# Patient Record
Sex: Male | Born: 1960 | Race: White | Hispanic: No | Smoking: Current some day smoker
Health system: Southern US, Community
[De-identification: ages and names within clinical notes are randomized; demographics above are authoritative.]

## PROBLEM LIST (undated history)

## (undated) DIAGNOSIS — I219 Acute myocardial infarction, unspecified: Secondary | ICD-10-CM

## (undated) DIAGNOSIS — F1910 Other psychoactive substance abuse, uncomplicated: Secondary | ICD-10-CM

## (undated) DIAGNOSIS — F419 Anxiety disorder, unspecified: Secondary | ICD-10-CM

## (undated) DIAGNOSIS — F32A Depression, unspecified: Secondary | ICD-10-CM

## (undated) DIAGNOSIS — I8500 Esophageal varices without bleeding: Secondary | ICD-10-CM

## (undated) DIAGNOSIS — K829 Disease of gallbladder, unspecified: Secondary | ICD-10-CM

## (undated) DIAGNOSIS — I2510 Atherosclerotic heart disease of native coronary artery without angina pectoris: Secondary | ICD-10-CM

## (undated) DIAGNOSIS — E785 Hyperlipidemia, unspecified: Secondary | ICD-10-CM

## (undated) DIAGNOSIS — K7460 Unspecified cirrhosis of liver: Secondary | ICD-10-CM

## (undated) DIAGNOSIS — K219 Gastro-esophageal reflux disease without esophagitis: Secondary | ICD-10-CM

## (undated) HISTORY — DX: Anxiety disorder, unspecified: F41.9

## (undated) HISTORY — DX: Gastro-esophageal reflux disease without esophagitis: K21.9

## (undated) HISTORY — DX: Acute myocardial infarction, unspecified: I21.9

## (undated) HISTORY — PX: KNEE SURGERY: SHX244

## (undated) HISTORY — DX: Depression, unspecified: F32.A

---

## 2000-07-21 ENCOUNTER — Encounter: Payer: Self-pay | Admitting: Emergency Medicine

## 2000-07-21 ENCOUNTER — Emergency Department (HOSPITAL_COMMUNITY): Admission: EM | Admit: 2000-07-21 | Discharge: 2000-07-21 | Payer: Self-pay | Admitting: Emergency Medicine

## 2000-08-12 ENCOUNTER — Encounter: Admission: RE | Admit: 2000-08-12 | Discharge: 2000-09-17 | Payer: Self-pay | Admitting: Occupational Medicine

## 2001-05-05 ENCOUNTER — Ambulatory Visit (HOSPITAL_BASED_OUTPATIENT_CLINIC_OR_DEPARTMENT_OTHER): Admission: RE | Admit: 2001-05-05 | Discharge: 2001-05-06 | Payer: Self-pay | Admitting: Orthopaedic Surgery

## 2002-02-01 ENCOUNTER — Encounter: Admission: RE | Admit: 2002-02-01 | Discharge: 2002-04-27 | Payer: Self-pay | Admitting: Orthopaedic Surgery

## 2002-03-02 ENCOUNTER — Emergency Department (HOSPITAL_COMMUNITY): Admission: EM | Admit: 2002-03-02 | Discharge: 2002-03-03 | Payer: Self-pay | Admitting: *Deleted

## 2007-12-19 ENCOUNTER — Ambulatory Visit: Payer: Self-pay | Admitting: Family Medicine

## 2007-12-19 DIAGNOSIS — K7460 Unspecified cirrhosis of liver: Secondary | ICD-10-CM | POA: Insufficient documentation

## 2007-12-21 ENCOUNTER — Telehealth: Payer: Self-pay | Admitting: Family Medicine

## 2008-12-04 ENCOUNTER — Emergency Department (HOSPITAL_COMMUNITY): Admission: EM | Admit: 2008-12-04 | Discharge: 2008-12-04 | Payer: Self-pay | Admitting: Emergency Medicine

## 2008-12-21 ENCOUNTER — Inpatient Hospital Stay (HOSPITAL_COMMUNITY): Admission: EM | Admit: 2008-12-21 | Discharge: 2008-12-23 | Payer: Self-pay | Admitting: Emergency Medicine

## 2009-02-08 ENCOUNTER — Ambulatory Visit: Payer: Self-pay | Admitting: Cardiovascular Disease

## 2009-02-08 ENCOUNTER — Inpatient Hospital Stay (HOSPITAL_COMMUNITY): Admission: EM | Admit: 2009-02-08 | Discharge: 2009-02-09 | Payer: Self-pay | Admitting: Emergency Medicine

## 2009-11-26 IMAGING — NM NM LIVER FUNCTION STUDY
1 series · 6 of 6 positions shown · non-contrast
Comparison: None

CLINICAL DATA: Cirrhosis, chest pain, question biliary obstruction

NUCLEAR MEDICINE HEPATOBILIARY IMAGING
TECHNIQUE: Sequential images of the abdomen were obtained [DATE] minutes following intravenous administration of
radiopharmaceutical.
Radiopharmaceutical:  4.7  mCi Bc-GGm Choletec

[he hepatobiliary · 3.22mm/px · 6 of 60 frames shown]
[frame 6/60]
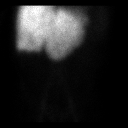
[frame 16/60]
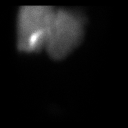
[frame 26/60]
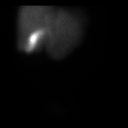
[frame 36/60]
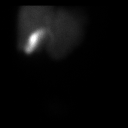
[frame 46/60]
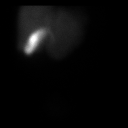
[frame 56/60]
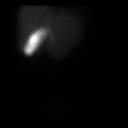

[6 of 6 positions shown; findings below may reference images not displayed]

FINDINGS: Prompt tracer extraction from bloodstream.
Prompt excretion of tracer into biliary tree.
Gallbladder visualized by 13 minutes.
Small bowel visualized at 65 minutes.
Prolonged hepatocellular retention of tracer indicating
hepatocellular dysfunction, compatible with diagnosis of cirrhosis.
A significant degree of tracer retention persists at 85 minutes.
No evidence of CBD or cystic duct obstruction.
IMPRESSION: Patent biliary tree without evidence of CBD or cystic duct
obstruction.
Hepatocellular dysfunction compatible with stated diagnosis of
cirrhosis.

## 2010-02-11 ENCOUNTER — Encounter: Admission: RE | Admit: 2010-02-11 | Discharge: 2010-02-11 | Payer: Self-pay | Admitting: Family Medicine

## 2010-11-09 LAB — COMPREHENSIVE METABOLIC PANEL
AST: 77 U/L — ABNORMAL HIGH (ref 0–37)
Albumin: 2.9 g/dL — ABNORMAL LOW (ref 3.5–5.2)
Alkaline Phosphatase: 948 U/L — ABNORMAL HIGH (ref 39–117)
BUN: 15 mg/dL (ref 6–23)
Chloride: 103 mEq/L (ref 96–112)
Potassium: 3.5 mEq/L (ref 3.5–5.1)
Total Bilirubin: 0.8 mg/dL (ref 0.3–1.2)

## 2010-11-09 LAB — BASIC METABOLIC PANEL
BUN: 11 mg/dL (ref 6–23)
CO2: 24 mEq/L (ref 19–32)
Calcium: 9.6 mg/dL (ref 8.4–10.5)
Chloride: 101 mEq/L (ref 96–112)
Creatinine, Ser: 0.69 mg/dL (ref 0.4–1.5)
Glucose, Bld: 107 mg/dL — ABNORMAL HIGH (ref 70–99)

## 2010-11-09 LAB — POCT CARDIAC MARKERS: Myoglobin, poc: 166 ng/mL (ref 12–200)

## 2010-11-09 LAB — RAPID URINE DRUG SCREEN, HOSP PERFORMED
Amphetamines: NOT DETECTED
Benzodiazepines: NOT DETECTED

## 2010-11-09 LAB — CARDIAC PANEL(CRET KIN+CKTOT+MB+TROPI)
CK, MB: 3.6 ng/mL (ref 0.3–4.0)
Relative Index: INVALID (ref 0.0–2.5)
Troponin I: 0.08 ng/mL — ABNORMAL HIGH (ref 0.00–0.06)
Troponin I: 0.08 ng/mL — ABNORMAL HIGH (ref 0.00–0.06)

## 2010-11-09 LAB — D-DIMER, QUANTITATIVE: D-Dimer, Quant: 0.4 ug/mL-FEU (ref 0.00–0.48)

## 2010-11-09 LAB — CBC
HCT: 32.9 % — ABNORMAL LOW (ref 39.0–52.0)
MCHC: 34.9 g/dL (ref 30.0–36.0)
MCV: 84.5 fL (ref 78.0–100.0)
Platelets: 117 10*3/uL — ABNORMAL LOW (ref 150–400)
Platelets: 118 10*3/uL — ABNORMAL LOW (ref 150–400)
RBC: 3.88 MIL/uL — ABNORMAL LOW (ref 4.22–5.81)
RDW: 14.5 % (ref 11.5–15.5)
WBC: 5.9 10*3/uL (ref 4.0–10.5)

## 2010-11-09 LAB — LIPID PANEL
LDL Cholesterol: 212 mg/dL — ABNORMAL HIGH (ref 0–99)
Triglycerides: 124 mg/dL (ref <150)

## 2010-11-09 LAB — DIFFERENTIAL
Basophils Absolute: 0 10*3/uL (ref 0.0–0.1)
Basophils Relative: 0 % (ref 0–1)
Eosinophils Absolute: 0 10*3/uL (ref 0.0–0.7)
Monocytes Relative: 10 % (ref 3–12)
Neutrophils Relative %: 71 % (ref 43–77)

## 2010-11-09 LAB — POCT I-STAT, CHEM 8
Calcium, Ion: 1.12 mmol/L (ref 1.12–1.32)
Chloride: 104 mEq/L (ref 96–112)
HCT: 36 % — ABNORMAL LOW (ref 39.0–52.0)
Sodium: 138 mEq/L (ref 135–145)
TCO2: 23 mmol/L (ref 0–100)

## 2010-11-09 LAB — APTT: aPTT: 42 seconds — ABNORMAL HIGH (ref 24–37)

## 2010-11-09 LAB — HEMOGLOBIN A1C: Mean Plasma Glucose: 103 mg/dL

## 2010-11-09 LAB — CK TOTAL AND CKMB (NOT AT ARMC): Total CK: 117 U/L (ref 7–232)

## 2010-11-09 LAB — TSH: TSH: 1.066 u[IU]/mL (ref 0.350–4.500)

## 2010-11-11 LAB — CBC
HCT: 32.2 % — ABNORMAL LOW (ref 39.0–52.0)
HCT: 36.7 % — ABNORMAL LOW (ref 39.0–52.0)
HCT: 38.4 % — ABNORMAL LOW (ref 39.0–52.0)
HCT: 39.1 % (ref 39.0–52.0)
HCT: 38.2 % — ABNORMAL LOW (ref 39.0–52.0)
Hemoglobin: 13.3 g/dL (ref 13.0–17.0)
MCHC: 34.5 g/dL (ref 30.0–36.0)
MCHC: 35.1 g/dL (ref 30.0–36.0)
MCV: 83.4 fL (ref 78.0–100.0)
MCV: 84.3 fL (ref 78.0–100.0)
MCV: 84.7 fL (ref 78.0–100.0)
MCV: 84.8 fL (ref 78.0–100.0)
Platelets: 100 10*3/uL — ABNORMAL LOW (ref 150–400)
Platelets: 116 10*3/uL — ABNORMAL LOW (ref 150–400)
Platelets: 96 10*3/uL — ABNORMAL LOW (ref 150–400)
Platelets: 106 10*3/uL — ABNORMAL LOW (ref 150–400)
RBC: 3.82 MIL/uL — ABNORMAL LOW (ref 4.22–5.81)
RBC: 4.59 MIL/uL (ref 4.22–5.81)
RDW: 15 % (ref 11.5–15.5)
RDW: 15.2 % (ref 11.5–15.5)
RDW: 15.6 % — ABNORMAL HIGH (ref 11.5–15.5)
WBC: 4.2 10*3/uL (ref 4.0–10.5)
WBC: 5.4 10*3/uL (ref 4.0–10.5)
WBC: 6.8 10*3/uL (ref 4.0–10.5)

## 2010-11-11 LAB — PHOSPHORUS: Phosphorus: 4.3 mg/dL (ref 2.3–4.6)

## 2010-11-11 LAB — POCT I-STAT, CHEM 8
BUN: 18 mg/dL (ref 6–23)
Creatinine, Ser: 1.1 mg/dL (ref 0.4–1.5)
Hemoglobin: 14.3 g/dL (ref 13.0–17.0)
Potassium: 3.7 mEq/L (ref 3.5–5.1)
Sodium: 138 mEq/L (ref 135–145)

## 2010-11-11 LAB — CARDIAC PANEL(CRET KIN+CKTOT+MB+TROPI)
CK, MB: 2.6 ng/mL (ref 0.3–4.0)
Relative Index: 4.2 — ABNORMAL HIGH (ref 0.0–2.5)
Total CK: 100 U/L (ref 7–232)
Total CK: 64 U/L (ref 7–232)
Troponin I: 0.14 ng/mL — ABNORMAL HIGH (ref 0.00–0.06)

## 2010-11-11 LAB — COMPREHENSIVE METABOLIC PANEL
AST: 103 U/L — ABNORMAL HIGH (ref 0–37)
AST: 82 U/L — ABNORMAL HIGH (ref 0–37)
AST: 79 U/L — ABNORMAL HIGH (ref 0–37)
Albumin: 3.7 g/dL (ref 3.5–5.2)
Albumin: 3.6 g/dL (ref 3.5–5.2)
Alkaline Phosphatase: 850 U/L — ABNORMAL HIGH (ref 39–117)
BUN: 15 mg/dL (ref 6–23)
CO2: 26 mEq/L (ref 19–32)
Calcium: 8.9 mg/dL (ref 8.4–10.5)
Calcium: 9.6 mg/dL (ref 8.4–10.5)
Chloride: 100 mEq/L (ref 96–112)
Chloride: 108 mEq/L (ref 96–112)
Chloride: 107 mEq/L (ref 96–112)
Creatinine, Ser: 0.99 mg/dL (ref 0.4–1.5)
Creatinine, Ser: 1 mg/dL (ref 0.4–1.5)
GFR calc Af Amer: >60 mL/min (ref >60)
GFR calc Af Amer: >60 mL/min (ref >60)
GFR calc non Af Amer: >60 mL/min (ref >60)
Glucose, Bld: 141 mg/dL — ABNORMAL HIGH (ref 70–99)
Potassium: 3.6 mEq/L (ref 3.5–5.1)
Total Bilirubin: 0.8 mg/dL (ref 0.3–1.2)
Total Bilirubin: 1.5 mg/dL — ABNORMAL HIGH (ref 0.3–1.2)
Total Bilirubin: 2.2 mg/dL — ABNORMAL HIGH (ref 0.3–1.2)
Total Protein: 7.2 g/dL (ref 6.0–8.3)

## 2010-11-11 LAB — D-DIMER, QUANTITATIVE: D-Dimer, Quant: 0.26 ug/mL-FEU (ref 0.00–0.48)

## 2010-11-11 LAB — PROTIME-INR
INR: 1 (ref 0.00–1.49)
Prothrombin Time: 12.8 seconds (ref 11.6–15.2)

## 2010-11-11 LAB — LIPID PANEL
HDL: 128 mg/dL (ref >39)
Triglycerides: 61 mg/dL (ref <150)
VLDL: 12 mg/dL (ref 0–40)

## 2010-11-11 LAB — T4, FREE: Free T4: 0.96 ng/dL (ref 0.80–1.80)

## 2010-11-11 LAB — GAMMA GT: GGT: 1274 U/L — ABNORMAL HIGH (ref 7–51)

## 2010-11-11 LAB — RAPID URINE DRUG SCREEN, HOSP PERFORMED
Barbiturates: NOT DETECTED
Cocaine: POSITIVE — AB
Opiates: POSITIVE — AB
Opiates: NOT DETECTED
Tetrahydrocannabinol: POSITIVE — AB
Tetrahydrocannabinol: POSITIVE — AB

## 2010-11-11 LAB — GLUCOSE, CAPILLARY
Glucose-Capillary: 105 mg/dL — ABNORMAL HIGH (ref 70–99)
Glucose-Capillary: 126 mg/dL — ABNORMAL HIGH (ref 70–99)
Glucose-Capillary: 170 mg/dL — ABNORMAL HIGH (ref 70–99)
Glucose-Capillary: 227 mg/dL — ABNORMAL HIGH (ref 70–99)

## 2010-11-11 LAB — DIFFERENTIAL
Basophils Absolute: 0.1 10*3/uL (ref 0.0–0.1)
Basophils Absolute: 0.1 10*3/uL (ref 0.0–0.1)
Eosinophils Absolute: 0.1 10*3/uL (ref 0.0–0.7)
Eosinophils Relative: 2 % (ref 0–5)
Lymphocytes Relative: 17 % (ref 12–46)
Lymphs Abs: 1.3 10*3/uL (ref 0.7–4.0)
Monocytes Absolute: 0.7 10*3/uL (ref 0.1–1.0)
Neutrophils Relative %: 72 % (ref 43–77)

## 2010-11-11 LAB — TSH: TSH: 2.057 u[IU]/mL (ref 0.350–4.500)

## 2010-11-11 LAB — BASIC METABOLIC PANEL
BUN: 15 mg/dL (ref 6–23)
GFR calc Af Amer: >60 mL/min (ref >60)
GFR calc non Af Amer: >60 mL/min (ref >60)
Potassium: 3.8 mEq/L (ref 3.5–5.1)

## 2010-11-11 LAB — POCT CARDIAC MARKERS
CKMB, poc: 5.6 ng/mL (ref 1.0–8.0)
Myoglobin, poc: 141 ng/mL (ref 12–200)
Troponin i, poc: <0.05 ng/mL (ref 0.00–0.09)

## 2010-11-11 LAB — CK TOTAL AND CKMB (NOT AT ARMC)
CK, MB: 6.2 ng/mL — ABNORMAL HIGH (ref 0.3–4.0)
Relative Index: 3.8 — ABNORMAL HIGH (ref 0.0–2.5)

## 2010-11-11 LAB — APTT: aPTT: 33 seconds (ref 24–37)

## 2010-11-11 LAB — HEMOGLOBIN A1C: Mean Plasma Glucose: 100 mg/dL

## 2010-11-11 LAB — MAGNESIUM: Magnesium: 2.4 mg/dL (ref 1.5–2.5)

## 2010-11-11 LAB — HEPARIN LEVEL (UNFRACTIONATED): Heparin Unfractionated: 0.2 IU/mL — ABNORMAL LOW (ref 0.30–0.70)

## 2010-12-16 NOTE — H&P (Signed)
NAME:  [name] Ford, [name] Ford NO.:  [id number]   MEDICAL RECORD NO.:  [id number]          PATIENT TYPE:  EMS   LOCATION:  MAJO                         FACILITY:  [facility]   PHYSICIAN:  Manus Gunning, MD      DATE OF BIRTH:  September 11, 1960   DATE OF ADMISSION:  12/21/2008  DATE OF DISCHARGE:                              HISTORY & PHYSICAL   CHIEF COMPLAINT:  Chest pain.   HISTORY OF PRESENT ILLNESS:  [name] Ford is a pleasant 49 year-old  Caucasian male who presented with chief complaint with substernal chest  pain describes as sharp in nature, 7 out of 10, worse with exertion,  improved with rest.  No overt precipitating factor.  He said it occurred  post smoking crack cocaine and tetrahydrocannabinol.  He denies any  radiation. He claims that the pain is sporadic in nature, associated  with occasional diaphoresis.  He denies nausea or vomiting.  The patient  was recently admitted to [facility] in May 2010  at which time a workup demonstrated an ejection fraction of 55% with  normal wall motion.  Cardiac catheterization demonstrated the patient to  have a 50% RCA lesion as well as 50% ramus lesion.  At this time the  patient denies headache, syncope, presyncope.  No tinnitus, no blurring  of vision, no odynophagia, no dysphagia, no palpitations, no PND, no  orthopnea.  No neck fullness. No fevers.  No loss of consciousness.  No  seizures.  No abdominal pain.  No nausea or vomiting.  No diarrhea,  constipation, dysuria, polyuria, hematuria, melenic stool or bright red  blood per rectum.   PAST MEDICAL/ PAST SURGICAL HISTORY:  1. Dyslipidemia.  2. Cirrhosis.  3. Diabetes mellitus type 2.   SOCIAL HISTORY:  Positive for tobacco.  He has a history of 30 pack  years.  Denies alcohol.  Positive for illicits - tetrahydrocannabinol  and cocaine.   FAMILY HISTORY:  Mother has no medical problems.  Father had history of  hypertension and died secondary to  CVA.   ALLERGIES:  No known drug allergies.   HOME MEDICATIONS:  Apparently the only medication knows is metformin 850  mg p.o. daily.   REVIEW OF SYSTEMS:  A 14 point review of systems was performed.  Pertinent positives and negatives as described above.   PHYSICAL EXAMINATION:  VITAL SIGNS ARE TIME OF PRESENTATION:  Temperature 97.2, heart rate 71, respiratory rate 18, blood pressure  115/60, O2 saturation 98% on room air.  GENERAL:  Well developed, well nourished Caucasian male sitting up in  bed comfortably in no acute distress.  HEENT:  Normocephalic, atraumatic.  Moist oral mucosa.  No thrush,  erythema or postnasal drip.  Eyes - anicteric.  Extraocular muscles  intact..  Pupils equal, round, reactive to light and accommodation.  CARDIOVASCULAR:  S1 and S2 normal.  Regular rate and rhythm.  No  murmurs, rubs or gallops.  RESPIRATORY:  Air entry is bilaterally equal.  No rales, rhonchi or  wheezing appreciated.  ABDOMEN:  Soft, nontender, nondistended.  Positive bowel sounds.  No  organomegaly.  EXTREMITIES:  No clubbing, cyanosis or edema.  Positive bilateral  dorsalis pedis pulses.  CNS:  Alert and oriented x 3. Cranial nerves II through XII grossly  intact.  Power, sensation, reflexes bilaterally symmetrical.  SKIN:  No breakdown, swelling, ulcerations or masses.  HEME/ONC:  No palpable lymphadenopathy, ecchymosis, bruising or  petechiae.   LABORATORY DATA:  Urine drug screen positive for cocaine, opiates and  tetrahydrocannabinol.   Troponin I less than 0.05.  CK MB 5.6, myoglobin 141.   Sodium 138, potassium 3.7, chloride 106, BUN 18, creatinine  1.1.   White blood cell count 20,300, hemoglobin 13.4, hematocrit 39.1,  platelets 116, polymorphs 72.   ASSESSMENT AND PLAN:  1. Chest pain.  This is most likely secondary to cardiovascular      stress inflicted by the use of cocaine.  I have discussed this with      the patient.  I have also explained to him he is at  risk for      myocardial infarction  and I feel he would be best be served by an      observation admission for serial cardiac enzymes for additional      hours.  If negative, the patient may be discharged home the      following morning.  I do not feel that 2D echocardiogram is      warranted at this time.  He will be started on aspirin 325 mg p.o.      daily as well as Zocor 40 mg p.o. q.h.s..  At this time we will      hold his metformin in case he does rule in.  Cardiac      catheterization may be required.  We will cover him with sliding      scale insulin protocol on a sensitive scale, start normal saline at      125 cc/hour.  I have also reviewed his chest x-ray personally; it      demonstrates no active cardiopulmonary disease.  I reviewed his EKG      and it demonstrates normal sinus rhythm with nonspecific ST-T wave      changes.  2. Tobacco/ cocaine/ tetrahydrocannabinol use.  I have counseled the      patient and explained to him the risks to his body and his health      with continued use of these drugs. He expressed understanding and      claims he will try to get into a rehabilitation program.      Manus Gunning, MD  Electronically Signed     SP/MEDQ  D:  12/21/2008  T:  12/21/2008  Job:  [id number]

## 2010-12-16 NOTE — H&P (Signed)
NAMESAMIR, ISHAQ NO.:  [id number]   MEDICAL RECORD NO.:  [id number]          PATIENT TYPE:  INP   LOCATION:  2923                         FACILITY:  [facility]   PHYSICIAN:  Noralyn Pick. Eden Emms, MD, FACCDATE OF BIRTH:  [birth year]   DATE OF ADMISSION:  02/08/2009  DATE OF DISCHARGE:                              HISTORY & PHYSICAL   Of note, Mr. Limberg's repeat blood work here in the ED has show elevated  troponin of 0.10.  He does have mild chest discomfort.  I discussed it  with Dr. Antoine Poche as well as with the patient, and we will plan to admit  and further evaluate for non-ST-elevation MI.  We will avoid beta-  blocker therapy in the setting of recent cocaine usage.  We will add  aspirin, heparin, statin, and nitrate.  The patient was initially  agreeable, but does say he may need to leave.  I advised that if he  left, he would be leaving against medical advice.      Nicolasa Ducking, ANP      Noralyn Pick. Eden Emms, MD, [facility]  Electronically Signed    CB/MEDQ  D:  02/08/2009  T:  02/09/2009  Job:  [id number]

## 2010-12-16 NOTE — Discharge Summary (Signed)
[name], [name]                ACCOUNT NO.:  [id number]   MEDICAL RECORD NO.:  [id number]          PATIENT TYPE:  OBV   LOCATION:  3730                         FACILITY:  [facility]   PHYSICIAN:  Charlestine Massed, MDDATE OF BIRTH:  05/03/1961   DATE OF ADMISSION:  12/21/2008  DATE OF DISCHARGE:  12/23/2008                               DISCHARGE SUMMARY   PRIMARY CARE PHYSICIAN:  Unassigned.   DISPOSITION:  The patient left the hospital without informing the nurse  and without signing an against medical advice form, on his own will.   DISCHARGE DIAGNOSES:  1. Active cocaine use and cocaine abuse.  2. Chest pain occurring at the time of active cocaine use.  3. Continuous tobacco, marijuana, and cocaine abuse.  4. Recent cardiac catheterization at [facility] showing 50%      RCA lesion and 50% ramus lesion, and no significant obstruction      otherwise.  5. Questionable diagnosis of diabetes mellitus but currently      hemoglobin A1c is 5.1.  6. Chronic gallbladder disease, possibly secondary to prior      cholecystitis, and exact etiology and pathology not known.   MEDICATIONS AT DISCHARGE:  1. Aspirin 325 mg daily.  2. NovoLog coverage on sensitive scale.  3. Zocor 40 mg p.o. daily.  4. IV fluids.   HOSPITAL COURSE BY PROBLEM:  1. Chest pain.  the patient came to the emergency room complaining of      chest pain.  This happened when he was actively snorting      cocaine/crack.  The patient has been using crack for quite some      time.  The chest pain was sharp in nature.  He was admitted for      evaluation of chest pain.  The EKG did not show any significant      changes.  There was slight elevation in troponin, which went up to      0.10 with the patient asymptomatic soon after he came to the      emergency room.  The patient was observed in view of that, and      decided the troponin was attributed to minimal cardiac injury that      happened with acute  cocaine use.  He had a recent CABG, which      showed a 50% RCA lesion and a ramus lesion of the RCA which was      done recently, and so no further evaluation currently needed at      this time.  The patient was being observed in the hospital.  2. Abdominal pain, questionable.  The patient complained of vague pain      in the abdomen.  The patient was evaluated and there was no      palpable guarding or organomegaly in the right upper quadrant.      Based on sonogram done yesterday, which revealed distended      gallbladder with pericholecystic fluid, and the common bile duct      was 6.4 mm.  The pancreatic duct was  also slightly prominent at 2      mm and the small nonspecific node was seen in the porta hepatis at      1.6 cm.  The spleen was also mildly enlarged, but upper limits of      normal at 13 cm total length.  The patient was advised to stay in      the hospital and he was planned for a HIDA scan today to rule out      gallbladder obstruction.  Sonogram did not reveal any stones.  The      patient was being planned to get the HIDA scan now.  Before that,      he walked out of the hospital by himself.  He was alert and awake,      and he was very well capable of making his own decisions at that      time.  3. Chronic cocaine and tobacco use, and marijuana use.  The patient      was educated very well during his stay about his issues.  He did      not have insurance to take care of most of these things, so social      worker consult was also called, and she spoke to him and she said      she will advise the financial advisor to come and speak to him.      The patient left the hospital before further steps could be done.   DISPOSITION:  The patient walked out of the hospital and he was very  well alert and awake, and capable of making his own decisions shortly  before he left.  A total of 30 minutes spent on this discussion with the  nurse and evaluating the incident.       Charlestine Massed, MD  Electronically Signed     UT/MEDQ  D:  12/23/2008  T:  12/23/2008  Job:  [id number]

## 2010-12-16 NOTE — Consult Note (Signed)
[name], [name] NO.:  [id number]   MEDICAL RECORD NO.:  [id number]          PATIENT TYPE:  INP   LOCATION:  2923                         FACILITY:  [facility]   PHYSICIAN:  [name] Pick. Eden Emms, MD, FACCDATE OF BIRTH:  31-Jan-1961   DATE OF CONSULTATION:  02/08/2009  DATE OF DISCHARGE:                                 CONSULTATION   PATIENT PROFILE:  A 50 year old Caucasian male with history of crack  cocaine use as well as chest pain who presents with recurrent chest  pain.   PROBLEMS:  1. Chest pain.      a.     Multiple admissions in the setting of crack cocaine use.      b.     Status post catheterization in [place] earlier this year,       70-50% lesion in the right coronary artery and ramus, otherwise,       nonobstructive disease, this is a previous report.      c.     Status post recent admission in May where the patient left       against medical advice.  2. Polysubstance abuse including marijuana, tobacco, and cocaine.  3. Hyperlipidemia.  4. Cirrhosis.  5. Questionable diabetes mellitus with hemoglobin A1c of 5.1 in May.  6. Chronic gallbladder disease.   HISTORY OF PRESENT ILLNESS:  A 50 year old Caucasian male with a history  of chest pain always in the setting of smoking tobacco and cocaine.  He  apparently had a cath in November 09, 2008, in [place] that showed  nonobstructive disease.  He was admitted in May 2010 to [facility] by  the Medicine Service with complaints of chest pain and positive crack  usage.  He subsequently ruled out and left AMA.  He has had no chest  pain since May 2010.  He says he only smokes crack about once a month  but was smoking all last night, all morning, and all afternoon.  About  1:30 p.m., he developed 10/10 substernal chest pressure, shortness of  breath, and presented to the [facility] ED.  He was treated with heparin  and nitroglycerin with reduction of discomfort to 2/10.  He says he is  feeling much better and  wants to go home.   ALLERGIES:  No known drug allergies.   HOME MEDICATIONS:  1. Metformin 850 mg daily.  2. Pravastatin 10 mg daily.   FAMILY HISTORY:  Mother is alive and well at 45.  Father died of a  stroke with history of hypertension in the 47.   SOCIAL HISTORY:  He lives in [place] with his son.  He delivers  pizza.  He has a 30 plus-pack-year history of tobacco abuse but  currently smoking 1 pack a day.  Denies alcohol use.  He smokes crack  cocaine monthly.  He smokes marijuana a few times a week.   REVIEW OF SYSTEMS:  Positive for chest pain and dyspnea; otherwise, all  systems reviewed and negative.  He is a full code.   PHYSICAL EXAMINATION:  VITAL SIGNS:  Temperature  97.9, heart rate 72,  respirations 18, blood pressure 114/65, pulse ox 90% on room air.  GENERAL:  Pleasant white male, in no acute distress, awake, alert and  oriented x3, normal affect.  HEENT:  Normal.  NEURO:  Grossly intact, nonfocal.  SKIN:  Without lesions or masses.  MUSCULOSKELETAL:  Grossly normal without deformity or effusion.  NECK:  Supple without bruits or JVD.  LUNGS:  Respirations regular and unlabored.  Clear to auscultation.  CARDIAC:  Regular, S1 and S2.  No S3, S4, murmurs.  ABDOMEN:  Round, soft, nontender, nondistended.  Bowel sounds present  x4.  EXTREMITIES:  Warm, dry, and pink.  No clubbing, cyanosis, or edema.  Dorsalis pedis, posterior tibial pulses 2+ and equal bilaterally.   Chest x-ray shows no acute cardiopulmonary abnormality.  EKG shows sinus  rhythm, at rate of 79, T-wave inversion, aVL.   Hemoglobin 12.0, hematocrit 34.4, WBCs 6.5, platelets 117.  Sodium 136,  potassium 3.6, chloride 101, CO2 of 24, BUN 11, creatinine 0.69, glucose  107.  D-dimer 0.40, CK-MB 5.5.  Troponin I less than 0.5.   ASSESSMENT AND PLAN:  1. Chest pain.  This is in the setting of crack cocaine usage.  First      set of enzymes are negative.  ECG shows no significant changes.   We      will repeat one set of enzymes and if negative, okay to discharge      from cardiac standpoint.  2. Polysubstance abuse.  Cessation advised in all fronts.  The patient      is not motivated to quit.      [name] Ford, ANP      [name] Pick. Eden Emms, MD, [facility]  Electronically Signed    CB/MEDQ  D:  02/08/2009  T:  02/09/2009  Job:  [id number]

## 2010-12-19 NOTE — Discharge Summary (Signed)
[name], Ford                ACCOUNT NO.:  [id number]   MEDICAL RECORD NO.:  [id number]          PATIENT TYPE:  INP   LOCATION:  3707                         FACILITY:  [facility]   PHYSICIAN:  Noralyn Pick. Eden Emms, MD, FACCDATE OF BIRTH:  1961/06/23   DATE OF ADMISSION:  02/08/2009  DATE OF DISCHARGE:  02/09/2009                               DISCHARGE SUMMARY   PRIMARY CARDIOLOGIST:  Theron Arista C. Eden Emms, MD, [facility]   DISCHARGE DIAGNOSIS:  Non-ST-elevation myocardial infarction.   SECONDARY DIAGNOSES:  1. Polysubstance abuse including marijuana, tobacco, and cocaine, all      ongoing.  2. Hyperlipidemia.  3. Cirrhosis.  4. Chronic gallbladder disease.  5. Noncompliance.   ALLERGIES:  No known drug allergies.   PROCEDURES:  None.   HISTORY OF PRESENT ILLNESS:  A 50 year old Caucasian male with prior  history of chest pain in the setting of cocaine abuse.  He presented to  [facility] on February 08, 2009, after having used marijuana and  cocaine throughout the prior evening and early morning hours of the February 08, 2009.  In the ED, he is known to have elevated troponin at 0.10 and  decision was made to admit him for further evaluation.  His ECG showed  no acute changes.   HOSPITAL COURSE:  Followup cardiac enzymes showed troponin of 0.08 x2  with normal CKs and MBs.  Arrangements were made for 2D echocardiogram  as well as cardiac catheterization; however, the patient left AMA on  February 09, 2009.  Labs at the time of leaving, hemoglobin 11.3, hematocrit  32.9, WBC 5.9, platelets 118.  Sodium 139, potassium 3.5, chloride 103,  CO2 of 28, BUN 15, creatinine 0.74, glucose 156.  Total bilirubin 0.8,  alkaline phosphatase 948, AST 77, ALT 88, total protein 6.5, albumin  2.9, calcium 9.0.  Hemoglobin A1c 5.2.  CK less than  7, MB 2.3, troponin I 0.08.  Total cholesterol is 326, triglycerides  124, HDL 89, LDL 212.  TSH 1.066.  Urine drug screen was positive for  cocaine and THC.   DISPOSITION:  The patient left the hospital against medical advice.      Nicolasa Ducking, ANP      Noralyn Pick. Eden Emms, MD, [facility]  Electronically Signed    CB/MEDQ  D:  03/13/2009  T:  03/14/2009  Job:  [id number]

## 2010-12-19 NOTE — Op Note (Signed)
[place]  Patient:    [name] Ford, [name] Ford Visit Number: [id number] MRN: [id number]          Service Type: DSU Location: [facility] Attending Physician:  Randolm Idol Dictated by:   Claude Manges. Cleophas Dunker, M.D. Proc. Date: 05/05/01 Admit Date:  05/05/2001                             Operative Report  PREOPERATIVE DIAGNOSIS: 1. Tear of anterior cruciate ligament, left knee. 2. Tear of medial meniscus. 3. Tear of lateral meniscus.  POSTOPERATIVE DIAGNOSIS: 1. Tear of anterior cruciate ligament, left knee. 2. Tear of medial meniscus. 3. Tear of lateral meniscus.  OPERATION PERFORMED: 1. Allograft anterior cruciate ligament reconstruction, left knee. 2. Partial medial meniscectomy. 3. Partial lateral meniscectomy.  SURGEON:  Claude Manges. Cleophas Dunker, M.D.  ASSISTANT:  Arnoldo Morale, P.A.  ANESTHESIA:  General orotracheal.  COMPLICATIONS:  None.  INDICATIONS FOR PROCEDURE:  The patient is a 50 year old white male sustained an on-the-job injury to his left knee in December 2001.  He continues to complain of pain, instability and recurrent effusion.  His MRI reveals a complete tear of the anterior cruciate ligament with tears of both the medial and lateral menisci.  He is now to have arthroscopic evaluation.  Probable debridement of medial and lateral menisci and allograft ACL reconstruction.  DESCRIPTION OF PROCEDURE:  With the patient comfortable on the operating table and under general orotracheal anesthesia, the left lower extremity was placed in a thigh tourniquet and thigh holder.  The leg was then prepped with DuraPrep from the thigh holder to the ankle.  Sterile draping was performed.  Diagnostic arthroscopy was performed using medial and lateral parapatellar tendon stab wound.  Irrigation was performed continuously with a cannula in the superior pouch medially.  There was no effusion.  Diagnostic arthroscopy revealed no evidence of loose  material in the superior pouch.  There was no chondromalacia patella.  There was a complete tear of the anterior cruciate ligament.  There were very few fibers remaining.  The posterior cruciate ligament remained intact.  In the medial compartment there was an obvious undersurface tear of the medial meniscus for a little over a centimeter.  It was through-and-through and I could not sublux the fragment anteriorly.  A partial medial meniscectomy was performed over the torn portion.  The rim was tapered anteriorly and posteriorly and then finally shaved with the intra-articular shaver.  There was a move extensive tear of the lateral meniscus.  There was large subluxing fragment attached to the very posterior horn.  There was flipping in the posterior space behind the meniscus and then anteriorly.  I amputated its base and then removed it in one large piece.  There was little remaining meniscus in the rim which was shaved with the intra-articular shaver.  There was further tearing more anteriorly on the meniscus and this was debrided with the basket forceps and then the shaver was introduced.  I had a very stable remnant.  We proceeded with an ACL allograft reconstruction.  A notchplasty was performed using the intra-articular shaver and the hooded bur.  I thought he had a very nice decompression.  There was no injury to the PCL.  The Arthrex guide system was utilized.  Our graft was 100 mm in length with bone plugs 10 mm in diameter and 25 mm in length.  A separate stab wound was made longitudinally medial to the  tibial tubercle and patellar tendon approximately 1 inch to 1-1/2 inch in length.  It was carried down to the subcutaneous tissues and then a subperiosteal dissection was performed.  The Arthrex tibial guide was inserted.  The guide pin was then placed into the joint.  It was a little bit medial in position, so we used a second guide to place the pin through the center of the ACL  stump about 5 to 7 mm anterior to the PCL.  It was directed towards the 1 oclock position in our notch plasty.  I felt this was in excellent position.  It was checked in both medial and lateral portals with the arthroscope.  A reamer was then inserted.  I cleaned the intra-articular hole with a shaver and the pituitary rongeur.  The femoral guide was then inserted and the Beeth needle was then inserted externalized along the anterolateral thigh.  The 10 mm reamer was inserted.  A starter hole was made.  We checked to be sure we had bone circumferentially and then we proceeded to ream approximately 27 to 28 mm.  We checked the hole and I had good bone throughout circumferentially.  The allograft was then inserted.  The suture was placed through the eye of the Beeth needle distal to the tibial hole and then the strings were advanced through the joint and out through the femoral hole.  The graft was then advanced without difficulty through the tibia intra-articular portion into the femoral hole guiding it with a probe and the arthroscope.  I checked to be sure that I had no impingement through a full range of motion and when I would tighten the strings, I had excellent stability with little if any drawer sign. We had approximately 5 mm or more of a drawer sign preoperatively.  A small stab wound was then made through the patellar tendon.  A 7 x 25 sheathed metallic interference screw was then introduced without difficulty over the guide pin placed anteriorly.  The screw was inserted with tension on the graft.  The graft remained intact without injury.  The guide pin was removed.  Joint was inspected.  There was no evidence of any loose material. A second guide pin was then placed over the distal graft into the tibial hole and a 7 x 20 mm screw inserted.  Excellent stability of the graft.  I checked the intra-articular portion and through a full range of motion it was perfectly stable.   There was no impingement and I had a negative anterior drawer sign.  There was full extension.   The joint was irrigated.  It was cleared of any loose material.  The strings were removed from the graft.  The incision distally was closed with several layers with 2-0 Vicryl and then skin clips.  The patellar tendon incision was then closed with clips.  0.25% Marcaine without epinephrine was injected into the wound edges.  Sterile bulky dressing was applied followed by an Ace bandage.  The tourniquet was deflated at an hour and 20 minutes with excellent capillary refill to the toes.  The patient tolerated the procedure well without complications.  PLAN:  [facility]  Percocet for pain.  Office one week. Dictated by: Claude Manges. Cleophas Dunker, M.D. Attending Physician:  Randolm Idol DD:  05/05/01 TD:  05/06/01 Job: [id number]

## 2011-08-21 ENCOUNTER — Other Ambulatory Visit: Payer: Self-pay

## 2011-09-01 ENCOUNTER — Encounter (HOSPITAL_COMMUNITY): Payer: Self-pay | Admitting: *Deleted

## 2011-09-01 ENCOUNTER — Observation Stay (HOSPITAL_COMMUNITY)
Admission: EM | Admit: 2011-09-01 | Discharge: 2011-09-03 | Discharge status: Against Medical Advice | Payer: 59 | Attending: Internal Medicine | Admitting: Internal Medicine

## 2011-09-01 DIAGNOSIS — I10 Essential (primary) hypertension: Secondary | ICD-10-CM | POA: Insufficient documentation

## 2011-09-01 DIAGNOSIS — I2510 Atherosclerotic heart disease of native coronary artery without angina pectoris: Secondary | ICD-10-CM | POA: Insufficient documentation

## 2011-09-01 DIAGNOSIS — E119 Type 2 diabetes mellitus without complications: Secondary | ICD-10-CM | POA: Insufficient documentation

## 2011-09-01 DIAGNOSIS — F17200 Nicotine dependence, unspecified, uncomplicated: Secondary | ICD-10-CM | POA: Insufficient documentation

## 2011-09-01 DIAGNOSIS — D696 Thrombocytopenia, unspecified: Secondary | ICD-10-CM | POA: Diagnosis present

## 2011-09-01 DIAGNOSIS — Z720 Tobacco use: Secondary | ICD-10-CM | POA: Diagnosis present

## 2011-09-01 DIAGNOSIS — R079 Chest pain, unspecified: Principal | ICD-10-CM | POA: Diagnosis present

## 2011-09-01 DIAGNOSIS — K7460 Unspecified cirrhosis of liver: Secondary | ICD-10-CM | POA: Insufficient documentation

## 2011-09-01 DIAGNOSIS — F1910 Other psychoactive substance abuse, uncomplicated: Secondary | ICD-10-CM | POA: Diagnosis present

## 2011-09-01 DIAGNOSIS — I252 Old myocardial infarction: Secondary | ICD-10-CM | POA: Insufficient documentation

## 2011-09-01 DIAGNOSIS — E785 Hyperlipidemia, unspecified: Secondary | ICD-10-CM | POA: Diagnosis present

## 2011-09-01 HISTORY — DX: Unspecified cirrhosis of liver: K74.60

## 2011-09-01 HISTORY — DX: Hyperlipidemia, unspecified: E78.5

## 2011-09-01 HISTORY — DX: Atherosclerotic heart disease of native coronary artery without angina pectoris: I25.10

## 2011-09-01 HISTORY — DX: Other psychoactive substance abuse, uncomplicated: F19.10

## 2011-09-01 HISTORY — DX: Disease of gallbladder, unspecified: K82.9

## 2011-09-01 LAB — GLUCOSE, CAPILLARY: Glucose-Capillary: 244 mg/dL — ABNORMAL HIGH (ref 70–99)

## 2011-09-01 LAB — COMPREHENSIVE METABOLIC PANEL
Alkaline Phosphatase: 919 U/L — ABNORMAL HIGH (ref 39–117)
BUN: 12 mg/dL (ref 6–23)
GFR calc Af Amer: >90 mL/min (ref >90)
Glucose, Bld: 165 mg/dL — ABNORMAL HIGH (ref 70–99)
Potassium: 3.6 mEq/L (ref 3.5–5.1)
Total Protein: 7.6 g/dL (ref 6.0–8.3)

## 2011-09-01 LAB — DIFFERENTIAL
Basophils Absolute: 0 10*3/uL (ref 0.0–0.1)
Eosinophils Absolute: 0.1 10*3/uL (ref 0.0–0.7)
Eosinophils Relative: 1 % (ref 0–5)
Eosinophils Relative: 2 % (ref 0–5)
Lymphocytes Relative: 21 % (ref 12–46)
Lymphs Abs: 1.6 10*3/uL (ref 0.7–4.0)
Monocytes Absolute: 0.5 10*3/uL (ref 0.1–1.0)
Monocytes Relative: 9 % (ref 3–12)
Neutro Abs: 4.2 10*3/uL (ref 1.7–7.7)

## 2011-09-01 LAB — CBC
HCT: 41.6 % (ref 39.0–52.0)
Hemoglobin: 13 g/dL (ref 13.0–17.0)
MCH: 27.5 pg (ref 26.0–34.0)
MCV: 80.3 fL (ref 78.0–100.0)
MCV: 81.9 fL (ref 78.0–100.0)
Platelets: 105 10*3/uL — ABNORMAL LOW (ref 150–400)
RDW: 15 % (ref 11.5–15.5)
WBC: 6.1 10*3/uL (ref 4.0–10.5)

## 2011-09-01 LAB — CARDIAC PANEL(CRET KIN+CKTOT+MB+TROPI)
CK, MB: 4.7 ng/mL — ABNORMAL HIGH (ref 0.3–4.0)
Relative Index: 2.7 — ABNORMAL HIGH (ref 0.0–2.5)
Troponin I: <0.3 ng/mL (ref <0.30)

## 2011-09-01 LAB — CK TOTAL AND CKMB (NOT AT ARMC): Total CK: 254 U/L — ABNORMAL HIGH (ref 7–232)

## 2011-09-01 LAB — CREATININE, SERUM: GFR calc Af Amer: >90 mL/min (ref >90)

## 2011-09-01 LAB — MAGNESIUM: Magnesium: 2.1 mg/dL (ref 1.5–2.5)

## 2011-09-01 LAB — TROPONIN I: Troponin I: <0.3 ng/mL (ref <0.30)

## 2011-09-01 MED ORDER — ENOXAPARIN SODIUM 40 MG/0.4ML ~~LOC~~ SOLN
40.0000 mg | SUBCUTANEOUS | Status: DC
  Administered 2011-09-01 – 2011-09-02 (×2): 40 mg via SUBCUTANEOUS
  Filled 2011-09-01 (×3): qty 0.4

## 2011-09-01 MED ORDER — ASPIRIN 325 MG PO TABS
325.0000 mg | ORAL_TABLET | Freq: Every day | ORAL | Status: DC
  Administered 2011-09-01 – 2011-09-03 (×3): 325 mg via ORAL
  Filled 2011-09-01 (×3): qty 1

## 2011-09-01 MED ORDER — MORPHINE SULFATE 2 MG/ML IJ SOLN
INTRAMUSCULAR | Status: AC
  Filled 2011-09-01: qty 1

## 2011-09-01 MED ORDER — METFORMIN HCL 500 MG PO TABS
1000.0000 mg | ORAL_TABLET | Freq: Two times a day (BID) | ORAL | Status: DC
  Filled 2011-09-01: qty 2

## 2011-09-01 MED ORDER — NITROGLYCERIN 0.4 MG SL SUBL: 0.4000 mg | SUBLINGUAL_TABLET | SUBLINGUAL | Status: DC | PRN

## 2011-09-01 MED ORDER — MORPHINE SULFATE 2 MG/ML IJ SOLN
2.0000 mg | INTRAMUSCULAR | Status: DC | PRN
  Administered 2011-09-01 – 2011-09-02 (×3): 2 mg via INTRAVENOUS
  Filled 2011-09-01 (×3): qty 1

## 2011-09-01 MED ORDER — PANTOPRAZOLE SODIUM 40 MG PO TBEC
40.0000 mg | DELAYED_RELEASE_TABLET | Freq: Two times a day (BID) | ORAL | Status: DC
  Administered 2011-09-02 – 2011-09-03 (×3): 40 mg via ORAL
  Filled 2011-09-01 (×3): qty 1

## 2011-09-01 MED ORDER — METFORMIN HCL 500 MG PO TABS
1000.0000 mg | ORAL_TABLET | Freq: Two times a day (BID) | ORAL | Status: DC
  Administered 2011-09-01 – 2011-09-02 (×2): 1000 mg via ORAL
  Filled 2011-09-01 (×4): qty 2

## 2011-09-01 MED ORDER — ASPIRIN 81 MG PO CHEW
324.0000 mg | CHEWABLE_TABLET | Freq: Once | ORAL | Status: AC
  Administered 2011-09-01: 324 mg via ORAL
  Filled 2011-09-01: qty 4

## 2011-09-01 MED ORDER — GI COCKTAIL ~~LOC~~
30.0000 mL | Freq: Once | ORAL | Status: AC
  Administered 2011-09-01: 30 mL via ORAL
  Filled 2011-09-01: qty 30

## 2011-09-01 MED ORDER — MORPHINE SULFATE 4 MG/ML IJ SOLN
4.0000 mg | Freq: Once | INTRAMUSCULAR | Status: AC
  Administered 2011-09-01: 4 mg via INTRAVENOUS
  Filled 2011-09-01: qty 1

## 2011-09-01 MED ORDER — SIMVASTATIN 20 MG PO TABS
20.0000 mg | ORAL_TABLET | Freq: Every day | ORAL | Status: DC
  Administered 2011-09-02: 20 mg via ORAL
  Filled 2011-09-01 (×2): qty 1

## 2011-09-01 NOTE — ED Notes (Signed)
[id number] Ready 

## 2011-09-01 NOTE — ED Notes (Signed)
Attempted to call report, awaiting return call from [phone]

## 2011-09-01 NOTE — ED Notes (Signed)
Pt complaining of 5/10 epigastric/chest pain. MD Ray notified.

## 2011-09-01 NOTE — ED Provider Notes (Addendum)
History     CSN: [id number]  Arrival date & time 09/01/11  1206   First MD Initiated Contact with Patient 09/01/11 1239      Chief Complaint  Patient presents with  . Chest Pain    (Consider location/radiation/quality/duration/timing/severity/associated sxs/prior treatment) HPI Patient had chest pain midsternal began at 0900 this a.m. Pain comes off and on  On lasting one to two seconds.  Patient had mi one year ago.  Last MI treated at [facility].  Patient used cocaine last night, continues to smoke, denies alcohol.  Pain is now pressure like at 7/10.  Past Medical History  Diagnosis Date  . MI (myocardial infarction)   . Cirrhosis   . Diabetes mellitus     History reviewed. No pertinent past surgical history.  No family history on file.  History  Substance Use Topics  . Smoking status: Current Everyday Smoker -- 1.0 packs/day  . Smokeless tobacco: Not on file  . Alcohol Use: No      Review of Systems  All other systems reviewed and are negative.    Allergies  Review of patient's allergies indicates no known allergies.  Home Medications   Current Outpatient Rx  Name Route Sig Dispense Refill  . METFORMIN HCL 1000 MG PO TABS Oral Take 1,000 mg by mouth 2 (two) times daily with a meal.      BP 125/74  Pulse 70  Temp 97.9 F (36.6 C)  Resp 18  SpO2 98%  Physical Exam  Nursing note and vitals reviewed. Constitutional: He is oriented to person, place, and time. He appears well-developed and well-nourished.  HENT:  Head: Normocephalic and atraumatic.  Right Ear: External ear normal.  Left Ear: External ear normal.  Eyes: Conjunctivae and EOM are normal. Pupils are equal, round, and reactive to light.  Neck: Normal range of motion. Neck supple.  Cardiovascular: Normal rate, regular rhythm and normal heart sounds.   Pulmonary/Chest: Effort normal and breath sounds normal.  Abdominal: Soft. Bowel sounds are normal.  Musculoskeletal: Normal range of  motion.  Neurological: He is alert and oriented to person, place, and time. He has normal reflexes.  Skin: Skin is warm and dry.  Psychiatric: He has a normal mood and affect.    ED Course  Procedures (including critical care time)  Labs Reviewed - No data to display No results found.   No diagnosis found.    Date: 09/01/2011  Rate: 76  Rhythm: normal sinus rhythm  QRS Axis: normal  Intervals: normal  ST/T Wave abnormalities: normal  Conduction Disutrbances:inferior q waves  Narrative Interpretation:   Old EKG Reviewed: inferior q waves noted    Discussed with Dr. Beverly Gust and will see to admit.     Hilario Quarry, MD 09/01/11 2952  Hilario Quarry, MD 09/01/11 [phone]

## 2011-09-01 NOTE — ED Notes (Signed)
Pt here per [facility] with c/o SSCP that started this am after using "cocaine all night".

## 2011-09-01 NOTE — H&P (Signed)
PCP:  [name] Georges, MD, MD Chief Complaint:  Chest pain started at about 9.00 a.m. today 09/01/2011   HPI:  Patient is a 51 year old Caucasian with prior MI status post cardiac cath with normal coronaries, liver cirrhosis, diabetes mellitus and hyperlipidemia was said to have smoked cocaine a day prior to him developing chest pain at about 900 a.m. on 09/01/2011.       Patient claimed that he was driving home about 161 a.m. on 09/01/2011 and suddenly developed precordial chest pain is about 7/10 intensity and was nonradiating .pain was described as sharp associated nausea but no vomiting. Patient was diaphoretic. He denied any radiation of the pain. He denied any fever, chills or rigors.he also denied any history of headache or neck pain. Pain persisted until patient got home, called EMS and was given nitroglycerin and subsequently brought to the emergency room to be evaluated. In the emergency room, patient was given IV morphine and also aspirin. He is currently chest pain-free. After evaluation, he was advised to be admitted to be ruled out for ACS   Review of Systems: patient presently denies any complaints.  The patient denies anorexia, fever, weight loss,, vision loss, decreased hearing, hoarseness, chest pain, syncope, dyspnea on exertion, peripheral edema, balance deficits, hemoptysis, abdominal pain, melena, hematochezia, severe indigestion/heartburn, hematuria, incontinence, genital sores, muscle weakness, suspicious skin lesions, transient blindness, difficulty walking, depression, unusual weight change, abnormal bleeding, enlarged lymph nodes, angioedema, and breast masses.  Past Medical History:  Past Medical History  Diagnosis Date  . MI (myocardial infarction)   . Cirrhosis   . Diabetes mellitus Hypertension Hyperlipidemia      History reviewed. No pertinent past surgical history.Cardiac catheterization-last year with normal coronaries  Medications:  Prior to  Admission medications   Medication Sig Start Date End Date Taking? Authorizing Provider  metFORMIN (GLUCOPHAGE) 1000 MG tablet Take 1,000 mg by mouth 2 (two) times daily with a meal.   Yes Historical Provider, MD    Allergies:  No Known Allergies  Social History:   reports that he has been smoking.  He does not have any smokeless tobacco history on file. He reports that he uses illicit drugs about once per week. He reports that he does not drink alcohol.  Family History:  No family history on file.  Physical Exam:  Filed Vitals:   09/01/11 1314 09/01/11 1402 09/01/11 1552 09/01/11 1711  BP: 125/74 135/81 115/65 117/70  Pulse: 70  71 69  Temp: 97.9 F (36.6 C) 98.1 F (36.7 C) 97.1 F (36.2 C)   TempSrc:  Oral Oral   Resp: 18     SpO2: 98% 98% 98% 99%      General: Alert and oriented times three, well developed and nourished, no acute distress, spider telangiectasia in anterior thorax  Eyes: PERRLA, pink conjunctiva, scleral icterus, xanthelesma below the left eye lid  ENT: Moist oral mucosa, neck supple, no thyromegaly  Lungs: clear to ascultation, no wheeze, no crackles, no use of accessory muscles  Cardiovascular: regular rate and rhythm, no regurgitation, no gallops, no murmurs. No carotid bruits, no JVD  Abdomen: soft, positive BS, non-tender, non-distended, no organomegaly, not an acute abdomen  GU: not examined  Neuro: CN II - XII grossly intact, sensation intact  Musculoskeletal: strength 5/5 all extremities, no clubbing, cyanosis or edema  Skin: no rash, no subcutaneous crepitation, no decubitus  Psych: appropriate patient  ?  Labs on Admission:   [facility] 09/01/11 1350  NA 137  K  3.6  CL 99  CO2 27  GLUCOSE 165*  BUN 12  CREATININE 0.57  CALCIUM 10.0  MG --  PHOS --     Basename 09/01/11 1350  AST 59*  ALT 96*  ALKPHOS 919*  BILITOT 0.7  PROT 7.6  ALBUMIN 3.4*    No results found for this basename: LIPASE:2,AMYLASE:2 in the  last 72 hours   Basename 09/01/11 1350  WBC 8.6  NEUTROABS 6.2  HGB 12.7*  HCT 37.0*  MCV 80.3  PLT 105*     Basename 09/01/11 1351  CKTOTAL 254*  CKMB 5.4*  CKMBINDEX --  TROPONINI <0.30    No results found for this basename: TSH,T4TOTAL,FREET3,T3FREE,THYROIDAB in the last 72 hours  No results found for this basename: VITAMINB12:2,FOLATE:2,FERRITIN:2,TIBC:2,IRON:2,RETICCTPCT:2 in the last 72 hours  Radiological Exams on Admission:  No results found.  Assessment/Plan  Present on Admission:  Problems: #1 chest pain #2 cocaine use #3 spider telangiectasia #4 xanthelesmata   Impression: #1 chest pain-cocaine induced questionable Prinzmetal angina rule out MI #2 history of substance abuse #3 prior MI status post cardiac cath with normal coronaries #4 diabetes mellitus #5 hypertension #6 hyperlipidemia #7 liver cirrhosis.  Plan: #1 admit to telemetry #2 give nitroglycerin when necessary, IV morphine, aspirin. #3 give Zocor hyperlipidemia #4 labs; and enzymes every 8x3, hemoglobin A1c, 2-D echo. #5 GI prophylaxis with Protonix and DVT prophylaxis Lovenox Patient will be evaluated on daily basis.           [name] Ford               [phone]

## 2011-09-02 DIAGNOSIS — Z720 Tobacco use: Secondary | ICD-10-CM | POA: Diagnosis present

## 2011-09-02 DIAGNOSIS — E785 Hyperlipidemia, unspecified: Secondary | ICD-10-CM | POA: Diagnosis present

## 2011-09-02 DIAGNOSIS — D696 Thrombocytopenia, unspecified: Secondary | ICD-10-CM | POA: Diagnosis present

## 2011-09-02 DIAGNOSIS — K829 Disease of gallbladder, unspecified: Secondary | ICD-10-CM | POA: Insufficient documentation

## 2011-09-02 DIAGNOSIS — Z9119 Patient's noncompliance with other medical treatment and regimen: Secondary | ICD-10-CM | POA: Insufficient documentation

## 2011-09-02 DIAGNOSIS — R079 Chest pain, unspecified: Secondary | ICD-10-CM

## 2011-09-02 DIAGNOSIS — Z91199 Patient's noncompliance with other medical treatment and regimen due to unspecified reason: Secondary | ICD-10-CM | POA: Insufficient documentation

## 2011-09-02 DIAGNOSIS — F1910 Other psychoactive substance abuse, uncomplicated: Secondary | ICD-10-CM | POA: Diagnosis present

## 2011-09-02 LAB — COMPREHENSIVE METABOLIC PANEL
ALT: 89 U/L — ABNORMAL HIGH (ref 0–53)
Albumin: 2.9 g/dL — ABNORMAL LOW (ref 3.5–5.2)
Alkaline Phosphatase: 833 U/L — ABNORMAL HIGH (ref 39–117)
BUN: 16 mg/dL (ref 6–23)
Chloride: 98 mEq/L (ref 96–112)
GFR calc Af Amer: >90 mL/min (ref >90)
Glucose, Bld: 159 mg/dL — ABNORMAL HIGH (ref 70–99)
Potassium: 4 mEq/L (ref 3.5–5.1)
Sodium: 135 mEq/L (ref 135–145)
Total Bilirubin: 0.5 mg/dL (ref 0.3–1.2)
Total Protein: 6.9 g/dL (ref 6.0–8.3)

## 2011-09-02 LAB — GLUCOSE, CAPILLARY
Glucose-Capillary: 142 mg/dL — ABNORMAL HIGH (ref 70–99)
Glucose-Capillary: 132 mg/dL — ABNORMAL HIGH (ref 70–99)
Glucose-Capillary: 227 mg/dL — ABNORMAL HIGH (ref 70–99)

## 2011-09-02 LAB — RAPID URINE DRUG SCREEN, HOSP PERFORMED
Barbiturates: POSITIVE — AB
Tetrahydrocannabinol: POSITIVE — AB

## 2011-09-02 LAB — CARDIAC PANEL(CRET KIN+CKTOT+MB+TROPI)
CK, MB: 3.5 ng/mL (ref 0.3–4.0)
Relative Index: INVALID (ref 0.0–2.5)
Total CK: 114 U/L (ref 7–232)
Troponin I: <0.3 ng/mL (ref <0.30)
Troponin I: <0.3 ng/mL (ref <0.30)

## 2011-09-02 LAB — MAGNESIUM: Magnesium: 1.9 mg/dL (ref 1.5–2.5)

## 2011-09-02 LAB — CBC
HCT: 36.3 % — ABNORMAL LOW (ref 39.0–52.0)
Hemoglobin: 12.1 g/dL — ABNORMAL LOW (ref 13.0–17.0)
MCHC: 33.3 g/dL (ref 30.0–36.0)
WBC: 7.1 10*3/uL (ref 4.0–10.5)

## 2011-09-02 MED ORDER — IBUPROFEN 400 MG PO TABS
400.0000 mg | ORAL_TABLET | Freq: Four times a day (QID) | ORAL | Status: DC | PRN
  Administered 2011-09-03: 400 mg via ORAL
  Filled 2011-09-02: qty 1

## 2011-09-02 MED ORDER — INSULIN ASPART 100 UNIT/ML ~~LOC~~ SOLN
0.0000 [IU] | Freq: Three times a day (TID) | SUBCUTANEOUS | Status: DC
  Administered 2011-09-02 (×2): 1 [IU] via SUBCUTANEOUS
  Filled 2011-09-02: qty 3

## 2011-09-02 MED ORDER — NICOTINE 14 MG/24HR TD PT24
14.0000 mg | MEDICATED_PATCH | Freq: Every day | TRANSDERMAL | Status: DC
  Administered 2011-09-02 – 2011-09-03 (×2): 14 mg via TRANSDERMAL
  Filled 2011-09-02 (×2): qty 1

## 2011-09-02 NOTE — Progress Notes (Signed)
  Echocardiogram 2D Echocardiogram has been performed.  [name] Ford 09/02/2011, 9:55 AM

## 2011-09-02 NOTE — Progress Notes (Signed)
Subjective: Patient denies any chest pain. No SOB  Objective: Vital signs in last 24 hours: Filed Vitals:   09/01/11 1711 09/01/11 1951 09/01/11 2100 09/02/11 0500  BP: 117/70 127/80 109/66 109/66  Pulse: 69 65 63 63  Temp:  98.4 F (36.9 C) 97.7 F (36.5 C) 97.7 F (36.5 C)  TempSrc:  Oral Oral Oral  Resp:  20 16 20   Height:  5\' 10"  (1.778 m)    Weight:  64.5 kg (142 lb 3.2 oz)    SpO2: 99% 99% 97% 97%    Intake/Output Summary (Last 24 hours) at 09/02/11 0910 Last data filed at 09/02/11 0600  Gross per 24 hour  Intake      0 ml  Output    900 ml  Net   -900 ml    Weight change:   General: Alert, awake, oriented x3, in no acute distress. HEENT: No bruits, no goiter. Heart: Regular rate and rhythm, without murmurs, rubs, gallops. Lungs: Clear to auscultation bilaterally. Abdomen: Soft, nontender, nondistended, positive bowel sounds. Extremities: No clubbing cyanosis or edema with positive pedal pulses. Neuro: Grossly intact, nonfocal.   Lab Results:  Basename 09/02/11 0320 09/01/11 2001 09/01/11 1350  NA 135 -- 137  K 4.0 -- 3.6  CL 98 -- 99  CO2 29 -- 27  GLUCOSE 159* -- 165*  BUN 16 -- 12  CREATININE 0.87 0.71 --  CALCIUM 9.6 -- 10.0  MG 1.9 2.1 --  PHOS -- -- --    Basename 09/02/11 0320 09/01/11 1350  AST 62* 59*  ALT 89* 96*  ALKPHOS 833* 919*  BILITOT 0.5 0.7  PROT 6.9 7.6  ALBUMIN 2.9* 3.4*   No results found for this basename: LIPASE:2,AMYLASE:2 in the last 72 hours  Basename 09/02/11 0320 09/01/11 2001 09/01/11 1350  WBC 7.1 6.1 --  NEUTROABS -- 4.2 6.2  HGB 12.1* 13.0 --  HCT 36.3* 41.6 --  MCV 81.4 81.9 --  PLT 116* 111* --    Basename 09/02/11 0320 09/01/11 2002 09/01/11 1351  CKTOTAL 114 176 254*  CKMB 3.5 4.7* 5.4*  CKMBINDEX -- -- --  TROPONINI <0.30 <0.30 <0.30   No components found with this basename: POCBNP:3 No results found for this basename: DDIMER:2 in the last 72 hours  Basename 09/01/11 2001  HGBA1C 6.3*   No  results found for this basename: CHOL:2,HDL:2,LDLCALC:2,TRIG:2,CHOLHDL:2,LDLDIRECT:2 in the last 72 hours No results found for this basename: TSH,T4TOTAL,FREET3,T3FREE,THYROIDAB in the last 72 hours No results found for this basename: VITAMINB12:2,FOLATE:2,FERRITIN:2,TIBC:2,IRON:2,RETICCTPCT:2 in the last 72 hours  Micro Results: No results found for this or any previous visit (from the past 240 hour(s)).  Studies/Results: No results found.  Medications:     . aspirin  324 mg Oral Once  . aspirin  325 mg Oral Daily  . enoxaparin  40 mg Subcutaneous Q24H  . gi cocktail  30 mL Oral Once  . metFORMIN  1,000 mg Oral BID WC  .  morphine injection  4 mg Intravenous Once  . nicotine  14 mg Transdermal Daily  . pantoprazole  40 mg Oral BID AC  . simvastatin  20 mg Oral q1800  . DISCONTD: metFORMIN  1,000 mg Oral BID WC    Assessment: Principal Problem:  *Chest pain Active Problems:  Hyperlipidemia  Polysubstance abuse  Diabetes mellitus  Tobacco abuse   Plan: #1. Chest Pain Likely cocaine induced. No chest pain today feeling better. Cardiac enzymes negative x 2. 2 d ECHO PENDING. Continue ASA, simvastatin. Check  FLP. Follow. Unable to use beta blocker secondary to cocaine use. If 2 d echo is abnormal will need a cardiology consult.  #2. Hyperlipidemia Check FLP. Simvastatin  #3. DMII HgbA1C =6.3. SSI. Hold metformin.  #4. Tobacco abuse Nicotine patch. Tobacco cessation  #5. PSA Patient has been counselled about polysubstance abuse. SW consult pending.  #6. Cirrhosis Stable. Follow.  #7. Chronic thrombocytopenia Stable.  #8. Prophylaxis PPI for GI, Lovenox for DVT   LOS: 1 day   Desirey Keahey 09/02/2011, 9:10 AM

## 2011-09-02 NOTE — Progress Notes (Signed)
Utilization Review Completed.Rehaan Viloria T1/30/2013   

## 2011-09-02 NOTE — Progress Notes (Signed)
Pt smokes 1 ppd but says he only smokes less than 1/2 ppd. Does not want for mom to know that he is smoking. When asked he says yes he does want to quit and also requests a patch from MD who was in the room for part of my visit. Recommended 14 mg patch based on his reporting that he smokes just under 1/2 ppd. Discussed and wrote down patch use instructions for the pt. Referred to 1-800 quit now for f/u and support. Discussed oral fixation substitutes, second hand smoke and in home smoking policy. Reviewed and gave pt Written education/contact information.

## 2011-09-03 ENCOUNTER — Encounter (HOSPITAL_COMMUNITY): Payer: Self-pay | Admitting: Physician Assistant

## 2011-09-03 DIAGNOSIS — R079 Chest pain, unspecified: Principal | ICD-10-CM

## 2011-09-03 LAB — BASIC METABOLIC PANEL
Calcium: 9.7 mg/dL (ref 8.4–10.5)
Chloride: 100 mEq/L (ref 96–112)
Creatinine, Ser: 0.91 mg/dL (ref 0.50–1.35)
GFR calc Af Amer: >90 mL/min (ref >90)
Sodium: 138 mEq/L (ref 135–145)

## 2011-09-03 LAB — LIPID PANEL: LDL Cholesterol: 246 mg/dL — ABNORMAL HIGH (ref 0–99)

## 2011-09-03 LAB — GLUCOSE, CAPILLARY: Glucose-Capillary: 120 mg/dL — ABNORMAL HIGH (ref 70–99)

## 2011-09-03 MED ORDER — LORAZEPAM 0.5 MG PO TABS
0.5000 mg | ORAL_TABLET | Freq: Once | ORAL | Status: AC
  Administered 2011-09-03: 0.5 mg via ORAL
  Filled 2011-09-03: qty 1

## 2011-09-03 MED ORDER — SIMVASTATIN 40 MG PO TABS
40.0000 mg | ORAL_TABLET | Freq: Every day | ORAL | Status: DC
  Filled 2011-09-03: qty 1

## 2011-09-03 NOTE — Consult Note (Signed)
CARDIOLOGY CONSULT NOTE  Patient ID: [name] Ford, MRN: [id number], DOB/AGE: [birth year] 51 y.o. Admit date: 09/01/2011 Date of Consult: 09/03/2011  Primary Physician: Kaleen Mask, MD, MD Primary Cardiologist: Previously seen in consult by Dr. Eden Emms in 2010  Chief Complaint: chest pain  HPI: 51 y/o M with hx nonobstructive CAD s/p cardiac catheterization, polysubstance abuse, DM, and cirrhosis who presented to [facility] following an episode of CP. We last saw him in 2010 following admission for CP with mildly elevated troponins in which cath/echo was recommended but he left AMA at that time. He returned 1/29 following an episode of CP occurring after using IV cocaine - while driving home he experienced a substernal stabbing pain 8/10 but was able to continue driving the few blocks to his home. He also reports chest palpitations diaphoresis, numbness in his LUE, nausea w/o vomiting, SOB, and weakness. He called EMS and was brought to [facility] where he as found to have UDS + for cocaine, THC, barbiturates. Initial CK/MB slightly elevated (254/5.4) but came down and troponins have been negative x 4. His EKG showed  NSR 76bpm, Poor r wave progression, TWI III, Possible inferior infarct, age undetermined but no ST elevations or depressions. He currently reports no CP, n/v, diaphoresis, HA, numbness/tingling, or SOB. He has not had prior exertional CP/SOB. He has felt well beside this episode. He reports the pain felt similar to an MI he was told he had at [facility] last year at which time no cath was performed.  In addition, the pt reports last smoking and using IV cocaine 1 year ago, and one year prior to that. He also reports smoking marijuana, which, "[He] don't consider a drug," 3-4 x/week for 30 years, and has a 35 year h/o smoking cigarettes. He does not drink any alcohol. He expresses a desire to stop using cocaine.  Past Medical History  Diagnosis Date  . CAD (coronary  artery disease)     Nonobstructive by cath in [facility] reportedly in 2010 with 50% RCA and 50% ramus . Last adm 03/2009 with CP/mildly elevated troponin recommended for cath/echo but patient left AMA  . Cirrhosis   . Diabetes mellitus   . Hyperlipidemia   . Polysubstance abuse     Hx of cocaine, tobacco, marijuana use  . Gallbladder disease       Surgical History:  L knee surgery 2002  Home Meds: Prior to Admission medications   Medication Sig Start Date End Date Taking? Authorizing Provider  metFORMIN (GLUCOPHAGE) 1000 MG tablet Take 1,000 mg by mouth 2 (two) times daily with a meal.   Yes Historical Provider, MD    Inpatient Medications:    . aspirin  325 mg Oral Daily  . enoxaparin  40 mg Subcutaneous Q24H  . insulin aspart  0-9 Units Subcutaneous TID WC  . LORazepam  0.5 mg Oral Once  . nicotine  14 mg Transdermal Daily  . pantoprazole  40 mg Oral BID AC  . simvastatin  40 mg Oral q1800  . DISCONTD: metFORMIN  1,000 mg Oral BID WC  . DISCONTD: simvastatin  20 mg Oral q1800    Allergies: No Known Allergies  History   Social History  . Marital Status: Divorced   Social History Main Topics  . Smoking status: Current Everyday Smoker -- 1.0 packs/day  . Alcohol Use: No  . Drug Use: 1 per week     Prior known hx of marijuana, tobacco, cocaine  Lives alone. Delivers  pizza. Re: cocaine -- he reports he knows he shouldn't do this but thought he could do, "just a little," and now realizes he was wrong.  Family History  Problem Relation Age of Onset  . Stroke Father   . Hypertension Father      Review of Systems: General: negative for chills, fever, night sweats or weight changes.  Cardiovascular: see HPI; negative for dyspnea on exertion Dermatological: negative for rash Respiratory: negative for cough or wheezing Urologic: negative for hematuria Abdominal: positive for nausea; negative for vomiting, diarrhea, bright red blood per rectum, melena, or  hematemesis Neurologic: negative for visual changes, syncope, or dizziness All other systems reviewed and are otherwise negative except as noted above.  Labs:  [facility] 09/02/11 1125 09/02/11 0320 09/01/11 2002 09/01/11 1351  CKTOTAL 84 114 176 254*  CKMB 3.3 3.5 4.7* 5.4*  TROPONINI <0.30 <0.30 <0.30 <0.30   Lab Results  Component Value Date   WBC 7.1 09/02/2011   HGB 12.1* 09/02/2011   HCT 36.3* 09/02/2011   MCV 81.4 09/02/2011   PLT 116* 09/02/2011    Lab 09/03/11 0545 09/02/11 0320  NA 138 --  K 4.1 --  CL 100 --  CO2 29 --  BUN 13 --  CREATININE 0.91 --  CALCIUM 9.7 --  PROT -- 6.9  BILITOT -- 0.5  ALKPHOS -- 833*  ALT -- 89*  AST -- 62*  GLUCOSE 110* --   Lab Results  Component Value Date   CHOL 336* 09/03/2011   HDL 49 09/03/2011   LDLCALC 246* 09/03/2011   TRIG 207* 09/03/2011   Radiology/Studies:  1. No CXR 2. 2D echo 09/02/11 Study Conclusions - Left ventricle: Wall thickness was increased in a pattern of mild LVH. Systolic function was normal. The estimated ejection fraction was in the range of 55% to 60%. Probablebasal inferior hypokinesis. Features are consistent with a pseudonormal left ventricular filling pattern, with concomitant abnormal relaxation and increased filling pressure (grade 2 diastolic dysfunction). - Aortic valve: There was no stenosis. - Mitral valve: Trivial regurgitation. - Left atrium: The atrium was mildly dilated. - Right ventricle: The cavity size was normal. Systolic function was normal. - Pulmonary arteries: PA peak pressure: 18mm Hg (S). - Inferior vena cava: The vessel was normal in size; the respirophasic diameter changes were in the normal range (= 50%); findings are consistent with normal central venous pressure. Impressions: - Normal LV size with mild LV hypertrophy. EF 55-60%. Possible basal inferior hypokinesis (though this is the most likely wall segment to show a false positive wall motion abnormality). Moderate diastolic  dysfunction. Normal RV size and systolic function.  EKG: NSR 76bpm. Poor r wave progression. TWI III. Possible inferior infarct, age undetermined  Physical Exam: Blood pressure 121/65, pulse 56, temperature 97.9 F (36.6 C), temperature source Oral, resp. rate 18, height 5\' 10"  (1.778 m), weight 142 lb 3.2 oz (64.5 kg), SpO2 97.00%. General: Well developed, well nourished thin WM in no acute distress. Head: Normocephalic, atraumatic, sclera non-icteric, no xanthomas, nares are without discharge. +xanathalasma streaking under left eye. Neck: Negative for carotid bruits. JVD not elevated. Lungs: Clear bilaterally to auscultation without wheezes, rales, or rhonchi. Breathing is unlabored. Heart: RRR with S1 S2. No murmurs, rubs, or gallops appreciated. Abdomen: Soft, non-tender, non-distended with normoactive bowel sounds. No hepatomegaly or acites. No rebound/guarding. No obvious abdominal masses. Msk:  Strength and tone appear normal for age. Extremities: No clubbing or cyanosis. No edema.  Distal pedal pulses are 2+ and equal  bilaterally. Neuro: Alert and oriented X 3. Moves all extremities spontaneously. Psych:  Responds to questions appropriately with a somewhat guarded affect.    Assessment and Plan:   1. Chest pain in the setting of cocaine use Negative enzymes thus far, atypical pain. EKG is abnormal at baseline but question chronicity. With underlying known CAD,woud recommend abstaining from substance abuse and following closely as outpatient for more typical symptoms. May need baseline CXR as well. Pt encouraged to abstain and educated on health risks of using cocaine, tobacco, and marijuana.   2. CAD Continue low-dose aspirin. Would recommend follow-up with PCP to monitor thrombocytopenia while on aspirin. Not being treated with B-blocker due to h/o intermittent cocaine use.  May need clearance from hepatologist regarding on if they are okay with him being on a chronic statin given  abnormal LFT's.  3. Poylsubstance abuse Continue encouragement of abstinence of illicit substances and treating with Nicoderm CQ. He is aware that IV drug use is a high-risk behavior.  4. Cirrhosis Continue to follow-up with Dr. Noelle Penner at [facility]. He may need repeat hepatitis serologies given IV drug use. Consider checking baseline PT/INR.  5. DM -type 2 Sugars well controlled here. Encouraged f/u with PCP, Dr. Jeannetta Nap in [place] - may need consideration for a different agent than Metformin in light of liver disease.   Signed, Ronie Spies PA-C 09/03/2011, 8:18 AM Patient interviewed and examined.   Agree with above findings and recommendations.   All of his episodes of chest pain have been associated with cocaine use.  I stressed the importance of avoiding cocaine in the future.  He will need close followup with Dr. Jeannetta Nap to monitor his diabetes and his liver function tests in response to statin therapy.  Agree that hepatologist's thoughts in this regard would be of interest. No further inpatient cardiac studies planned. Thank you for consult.

## 2011-09-03 NOTE — Discharge Summary (Signed)
Discharge Summary  [name] Ford MR#: [id number]  DOB:1960/10/27  Date of Admission: 09/01/2011 Date of Discharge: 09/03/2011  Patient's PCP: Kaleen Mask, MD, MD  Attending Physician:Kayleigh Broadwell     Patient left AGAINST MEDICAL ADVICE  Consults: Treatment Team:  #1 cardiology: Cassell Clement, MD   Discharge Diagnoses: Chest pain Present on Admission:  .Chest pain .Hyperlipidemia .Polysubstance abuse .Diabetes mellitus .Tobacco abuse .Thrombocytopenia   Brief Admitting History and Physical Patient is a 51 year old Caucasian with prior MI status post cardiac cath with normal coronaries, liver cirrhosis, diabetes mellitus and hyperlipidemia was said to have smoked cocaine a day prior to him developing chest pain at about 900 a.m. on 09/01/2011.  Patient claimed that he was driving home about 782 a.m. on 09/01/2011 and suddenly developed precordial chest pain is about 7/10 intensity and was nonradiating .pain was described as sharp associated nausea but no vomiting. Patient was diaphoretic. He denied any radiation of the pain. He denied any fever, chills or rigors.he also denied any history of headache or neck pain. Pain persisted until patient got home, called EMS and was given nitroglycerin and subsequently brought to the emergency room to be evaluated. In the emergency room, patient was given IV morphine and also aspirin. He is currently chest pain-free. After evaluation, he was advised to be admitted to be ruled out for ACS  Review of Systems: patient presently denies any complaints.  The patient denies anorexia, fever, weight loss,, vision loss, decreased hearing, hoarseness, chest pain, syncope, dyspnea on exertion, peripheral edema, balance deficits, hemoptysis, abdominal pain, melena, hematochezia, severe indigestion/heartburn, hematuria, incontinence, genital sores, muscle weakness, suspicious skin lesions, transient blindness, difficulty walking, depression, unusual  weight change, abnormal bleeding, enlarged lymph nodes, angioedema, and breast masses.   Discharge Medications Medication List  As of 09/03/2011  6:23 PM   ASK your doctor about these medications         metFORMIN 1000 MG tablet   Commonly known as: [facility] Course: Chest pain Patient was admitted with chest pain in the setting of cocaine abuse and polysubstance abuse. Due to patient's prior history of 50-70% RCA lesion her records patient was placed on telemetry and cardiac enzymes were cycled. Cardiac enzymes which was cycled were negative x3. A 2-D echo was subsequently obtained. Patient was counseled against polysubstance abuse. 2-D echo which was obtained did show an EF of 55-60%. Posterobasal inferior hypokinesis. Moderate diastolic dysfunction. Normal right ventricular size and systolic function. Due to patient's history of coronary artery disease, alongside his multiple risk factors of diabetes hyperlipidemia tobacco abuse polysubstance abuse a cardiology consultation was obtained. Patient was seen in consultation by Dr. Patty Sermons cardiology on 09/03/2011. Per cardiology recommendations although patient's EKG was abnormal it was at baseline. Patient was encouraged to also abstain and educated on the health risk of polysubstance abuse. Patient's chest pain improved during the hospitalization and due to patient being asymptomatic with negative cardiac enzymes normal ejection fraction it was felt per cardiology that no further inpatient cardiac workup was needed at this time. It was recommended for patient to abstain from polysubstance abuse, will need to followup with his hepatologist in terms of whether to continue with his statin and to followup with his PCP as outpatient. Patient subsequently left the hospital AGAINST MEDICAL ADVICE prior to being seen by the primary team.  Present on Admission:  .Chest pain .Hyperlipidemia .Polysubstance abuse .Diabetes  mellitus .Tobacco abuse .Thrombocytopenia   Day  of Discharge BP 121/65  Pulse 56  Temp(Src) 97.9 F (36.6 C) (Oral)  Resp 18  Ht 5\' 10"  (1.778 m)  Wt 64.5 kg (142 lb 3.2 oz)  BMI 20.40 kg/m2  SpO2 97% Patient left AGAINST MEDICAL ADVICE prior to being seen.  Results for orders placed during the hospital encounter of 09/01/11 (from the past 48 hour(s))  HEMOGLOBIN A1C     Status: Abnormal   Collection Time   09/01/11  8:01 PM      Component Value Range Comment   Hemoglobin A1C 6.3 (*) <5.7 (%)    Mean Plasma Glucose 134 (*) <117 (mg/dL)   MAGNESIUM     Status: Normal   Collection Time   09/01/11  8:01 PM      Component Value Range Comment   Magnesium 2.1  1.5 - 2.5 (mg/dL)   CBC     Status: Abnormal   Collection Time   09/01/11  8:01 PM      Component Value Range Comment   WBC 6.1  4.0 - 10.5 (K/uL)    RBC 5.08  4.22 - 5.81 (MIL/uL)    Hemoglobin 13.0  13.0 - 17.0 (g/dL)    HCT 09.8  11.9 - 14.7 (%)    MCV 81.9  78.0 - 100.0 (fL)    MCH 25.6 (*) 26.0 - 34.0 (pg)    MCHC 31.3  30.0 - 36.0 (g/dL)    RDW 82.9  56.2 - 13.0 (%)    Platelets 111 (*) 150 - 400 (K/uL) CONSISTENT WITH PREVIOUS RESULT  DIFFERENTIAL     Status: Normal   Collection Time   09/01/11  8:01 PM      Component Value Range Comment   Neutrophils Relative 69  43 - 77 (%)    Neutro Abs 4.2  1.7 - 7.7 (K/uL)    Lymphocytes Relative 21  12 - 46 (%)    Lymphs Abs 1.3  0.7 - 4.0 (K/uL)    Monocytes Relative 9  3 - 12 (%)    Monocytes Absolute 0.5  0.1 - 1.0 (K/uL)    Eosinophils Relative 2  0 - 5 (%)    Eosinophils Absolute 0.1  0.0 - 0.7 (K/uL)    Basophils Relative 0  0 - 1 (%)    Basophils Absolute 0.0  0.0 - 0.1 (K/uL)   CREATININE, SERUM     Status: Normal   Collection Time   09/01/11  8:01 PM      Component Value Range Comment   Creatinine, Ser 0.71  0.50 - 1.35 (mg/dL)    GFR calc non Af Amer >90  >90 (mL/min)    GFR calc Af Amer >90  >90 (mL/min)   CARDIAC PANEL(CRET KIN+CKTOT+MB+TROPI)      Status: Abnormal   Collection Time   09/01/11  8:02 PM      Component Value Range Comment   Total CK 176  7 - 232 (U/L)    CK, MB 4.7 (*) 0.3 - 4.0 (ng/mL)    Troponin I <0.30  <0.30 (ng/mL)    Relative Index 2.7 (*) 0.0 - 2.5    GLUCOSE, CAPILLARY     Status: Abnormal   Collection Time   09/01/11 10:00 PM      Component Value Range Comment   Glucose-Capillary 244 (*) 70 - 99 (mg/dL)    Comment 1 Notify RN     CARDIAC PANEL(CRET KIN+CKTOT+MB+TROPI)     Status: Abnormal   Collection Time  09/02/11  3:20 AM      Component Value Range Comment   Total CK 114  7 - 232 (U/L)    CK, MB 3.5  0.3 - 4.0 (ng/mL)    Troponin I <0.30  <0.30 (ng/mL)    Relative Index 3.1 (*) 0.0 - 2.5    CBC     Status: Abnormal   Collection Time   09/02/11  3:20 AM      Component Value Range Comment   WBC 7.1  4.0 - 10.5 (K/uL)    RBC 4.46  4.22 - 5.81 (MIL/uL)    Hemoglobin 12.1 (*) 13.0 - 17.0 (g/dL)    HCT 16.1 (*) 09.6 - 52.0 (%)    MCV 81.4  78.0 - 100.0 (fL)    MCH 27.1  26.0 - 34.0 (pg)    MCHC 33.3  30.0 - 36.0 (g/dL)    RDW 04.5  40.9 - 81.1 (%)    Platelets 116 (*) 150 - 400 (K/uL) CONSISTENT WITH PREVIOUS RESULT  COMPREHENSIVE METABOLIC PANEL     Status: Abnormal   Collection Time   09/02/11  3:20 AM      Component Value Range Comment   Sodium 135  135 - 145 (mEq/L)    Potassium 4.0  3.5 - 5.1 (mEq/L)    Chloride 98  96 - 112 (mEq/L)    CO2 29  19 - 32 (mEq/L)    Glucose, Bld 159 (*) 70 - 99 (mg/dL)    BUN 16  6 - 23 (mg/dL)    Creatinine, Ser 9.14  0.50 - 1.35 (mg/dL)    Calcium 9.6  8.4 - 10.5 (mg/dL)    Total Protein 6.9  6.0 - 8.3 (g/dL)    Albumin 2.9 (*) 3.5 - 5.2 (g/dL)    AST 62 (*) 0 - 37 (U/L)    ALT 89 (*) 0 - 53 (U/L)    Alkaline Phosphatase 833 (*) 39 - 117 (U/L)    Total Bilirubin 0.5  0.3 - 1.2 (mg/dL)    GFR calc non Af Amer >90  >90 (mL/min)    GFR calc Af Amer >90  >90 (mL/min)   MAGNESIUM     Status: Normal   Collection Time   09/02/11  3:20 AM      Component  Value Range Comment   Magnesium 1.9  1.5 - 2.5 (mg/dL)   GLUCOSE, CAPILLARY     Status: Abnormal   Collection Time   09/02/11  7:57 AM      Component Value Range Comment   Glucose-Capillary 112 (*) 70 - 99 (mg/dL)   CARDIAC PANEL(CRET KIN+CKTOT+MB+TROPI)     Status: Normal   Collection Time   09/02/11 11:25 AM      Component Value Range Comment   Total CK 84  7 - 232 (U/L)    CK, MB 3.3  0.3 - 4.0 (ng/mL)    Troponin I <0.30  <0.30 (ng/mL)    Relative Index RELATIVE INDEX IS INVALID  0.0 - 2.5    GLUCOSE, CAPILLARY     Status: Abnormal   Collection Time   09/02/11 11:59 AM      Component Value Range Comment   Glucose-Capillary 142 (*) 70 - 99 (mg/dL)    Comment 1 Notify RN     GLUCOSE, CAPILLARY     Status: Abnormal   Collection Time   09/02/11  4:52 PM      Component Value Range Comment   Glucose-Capillary 132 (*)  70 - 99 (mg/dL)    Comment 1 Notify RN     URINE RAPID DRUG SCREEN (HOSP PERFORMED)     Status: Abnormal   Collection Time   09/02/11  7:30 PM      Component Value Range Comment   Opiates NONE DETECTED  NONE DETECTED     Cocaine POSITIVE (*) NONE DETECTED     Benzodiazepines NONE DETECTED  NONE DETECTED     Amphetamines NONE DETECTED  NONE DETECTED     Tetrahydrocannabinol POSITIVE (*) NONE DETECTED     Barbiturates POSITIVE (*) NONE DETECTED    GLUCOSE, CAPILLARY     Status: Abnormal   Collection Time   09/02/11  8:51 PM      Component Value Range Comment   Glucose-Capillary 227 (*) 70 - 99 (mg/dL)    Comment 1 Notify RN     BASIC METABOLIC PANEL     Status: Abnormal   Collection Time   09/03/11  5:45 AM      Component Value Range Comment   Sodium 138  135 - 145 (mEq/L)    Potassium 4.1  3.5 - 5.1 (mEq/L)    Chloride 100  96 - 112 (mEq/L)    CO2 29  19 - 32 (mEq/L)    Glucose, Bld 110 (*) 70 - 99 (mg/dL)    BUN 13  6 - 23 (mg/dL)    Creatinine, Ser 4.19  0.50 - 1.35 (mg/dL)    Calcium 9.7  8.4 - 10.5 (mg/dL)    GFR calc non Af Amer >90  >90 (mL/min)    GFR  calc Af Amer >90  >90 (mL/min)   LIPID PANEL     Status: Abnormal   Collection Time   09/03/11  5:45 AM      Component Value Range Comment   Cholesterol 336 (*) 0 - 200 (mg/dL)    Triglycerides 379 (*) <150 (mg/dL)    HDL 49  >02 (mg/dL)    Total CHOL/HDL Ratio 6.9      VLDL 41 (*) 0 - 40 (mg/dL)    LDL Cholesterol 409 (*) 0 - 99 (mg/dL)   GLUCOSE, CAPILLARY     Status: Abnormal   Collection Time   09/03/11  7:53 AM      Component Value Range Comment   Glucose-Capillary 120 (*) 70 - 99 (mg/dL)    Comment 1 Notify RN       No results found.   Disposition: The patient left AGAINST MEDICAL ADVICE and went home  Diet: Low sodium diet/patient left AGAINST MEDICAL ADVICE  Activity: Increase activity slowly/patient left AGAINST MEDICAL ADVICE   Follow-up Appts: Patient left AGAINST MEDICAL ADVICE hopefully patient will followup with his PCP.   TESTS THAT NEED FOLLOW-UP   Time spent on discharge, talking to the patient, and coordinating care: 35 mins.   SignedRamiro Harvest 09/03/2011, 6:23 PM

## 2017-04-29 DIAGNOSIS — E119 Type 2 diabetes mellitus without complications: Secondary | ICD-10-CM | POA: Diagnosis not present

## 2017-04-29 DIAGNOSIS — K7460 Unspecified cirrhosis of liver: Secondary | ICD-10-CM | POA: Diagnosis not present

## 2017-04-29 DIAGNOSIS — Z049 Encounter for examination and observation for unspecified reason: Secondary | ICD-10-CM | POA: Diagnosis not present

## 2017-10-11 DIAGNOSIS — Z049 Encounter for examination and observation for unspecified reason: Secondary | ICD-10-CM | POA: Diagnosis not present

## 2017-10-11 DIAGNOSIS — E119 Type 2 diabetes mellitus without complications: Secondary | ICD-10-CM | POA: Diagnosis not present

## 2018-05-09 DIAGNOSIS — E785 Hyperlipidemia, unspecified: Secondary | ICD-10-CM | POA: Diagnosis not present

## 2018-05-09 DIAGNOSIS — E119 Type 2 diabetes mellitus without complications: Secondary | ICD-10-CM | POA: Diagnosis not present

## 2018-05-09 DIAGNOSIS — Z23 Encounter for immunization: Secondary | ICD-10-CM | POA: Diagnosis not present

## 2018-05-09 DIAGNOSIS — K7460 Unspecified cirrhosis of liver: Secondary | ICD-10-CM | POA: Diagnosis not present

## 2018-12-14 DIAGNOSIS — E119 Type 2 diabetes mellitus without complications: Secondary | ICD-10-CM | POA: Diagnosis not present

## 2018-12-14 DIAGNOSIS — K7460 Unspecified cirrhosis of liver: Secondary | ICD-10-CM | POA: Diagnosis not present

## 2018-12-14 DIAGNOSIS — Z1211 Encounter for screening for malignant neoplasm of colon: Secondary | ICD-10-CM | POA: Diagnosis not present

## 2018-12-14 DIAGNOSIS — R319 Hematuria, unspecified: Secondary | ICD-10-CM | POA: Diagnosis not present

## 2019-07-10 DIAGNOSIS — E119 Type 2 diabetes mellitus without complications: Secondary | ICD-10-CM | POA: Diagnosis not present

## 2019-11-20 DIAGNOSIS — Z049 Encounter for examination and observation for unspecified reason: Secondary | ICD-10-CM | POA: Diagnosis not present

## 2019-11-20 DIAGNOSIS — E119 Type 2 diabetes mellitus without complications: Secondary | ICD-10-CM | POA: Diagnosis not present

## 2020-02-14 DIAGNOSIS — R252 Cramp and spasm: Secondary | ICD-10-CM | POA: Diagnosis not present

## 2020-02-14 DIAGNOSIS — K7460 Unspecified cirrhosis of liver: Secondary | ICD-10-CM | POA: Diagnosis not present

## 2020-02-14 DIAGNOSIS — E119 Type 2 diabetes mellitus without complications: Secondary | ICD-10-CM | POA: Diagnosis not present

## 2020-08-21 DIAGNOSIS — K7460 Unspecified cirrhosis of liver: Secondary | ICD-10-CM | POA: Diagnosis not present

## 2020-08-21 DIAGNOSIS — E119 Type 2 diabetes mellitus without complications: Secondary | ICD-10-CM | POA: Diagnosis not present

## 2020-08-21 DIAGNOSIS — Z0000 Encounter for general adult medical examination without abnormal findings: Secondary | ICD-10-CM | POA: Diagnosis not present

## 2020-08-26 DIAGNOSIS — R7989 Other specified abnormal findings of blood chemistry: Secondary | ICD-10-CM | POA: Diagnosis not present

## 2020-08-26 DIAGNOSIS — E875 Hyperkalemia: Secondary | ICD-10-CM | POA: Diagnosis not present

## 2020-09-02 DIAGNOSIS — E875 Hyperkalemia: Secondary | ICD-10-CM | POA: Diagnosis not present

## 2020-09-12 DIAGNOSIS — E875 Hyperkalemia: Secondary | ICD-10-CM | POA: Diagnosis not present

## 2021-02-18 DIAGNOSIS — E569 Vitamin deficiency, unspecified: Secondary | ICD-10-CM | POA: Diagnosis not present

## 2021-02-18 DIAGNOSIS — E119 Type 2 diabetes mellitus without complications: Secondary | ICD-10-CM | POA: Diagnosis not present

## 2021-02-18 DIAGNOSIS — R7989 Other specified abnormal findings of blood chemistry: Secondary | ICD-10-CM | POA: Diagnosis not present

## 2021-02-18 DIAGNOSIS — K7460 Unspecified cirrhosis of liver: Secondary | ICD-10-CM | POA: Diagnosis not present

## 2021-02-20 DIAGNOSIS — D696 Thrombocytopenia, unspecified: Secondary | ICD-10-CM | POA: Diagnosis not present

## 2021-02-27 DIAGNOSIS — R799 Abnormal finding of blood chemistry, unspecified: Secondary | ICD-10-CM | POA: Diagnosis not present

## 2021-02-27 DIAGNOSIS — E875 Hyperkalemia: Secondary | ICD-10-CM | POA: Diagnosis not present

## 2021-03-06 ENCOUNTER — Telehealth: Payer: Self-pay | Admitting: Physician Assistant

## 2021-03-06 NOTE — Telephone Encounter (Signed)
Received a new hem referral from Dr. Jeannetta Nap for thrombocytopenia and decreased wbc. Mr. [name] Ford returned my call and scheduled to see [name] on 8/17 at 2pm. Pt aware to arrive 20 minutes early.

## 2021-03-19 ENCOUNTER — Other Ambulatory Visit: Payer: Self-pay

## 2021-03-19 ENCOUNTER — Encounter: Payer: Self-pay | Admitting: Physician Assistant

## 2021-03-19 ENCOUNTER — Inpatient Hospital Stay: Payer: PPO | Attending: Physician Assistant | Admitting: Physician Assistant

## 2021-03-19 ENCOUNTER — Inpatient Hospital Stay: Payer: PPO

## 2021-03-19 VITALS — BP 148/74 | HR 69 | Temp 98.9°F | Resp 17 | Ht 70.0 in | Wt 135.7 lb

## 2021-03-19 DIAGNOSIS — R5382 Chronic fatigue, unspecified: Secondary | ICD-10-CM

## 2021-03-19 DIAGNOSIS — K7469 Other cirrhosis of liver: Secondary | ICD-10-CM | POA: Diagnosis not present

## 2021-03-19 DIAGNOSIS — Z803 Family history of malignant neoplasm of breast: Secondary | ICD-10-CM | POA: Diagnosis not present

## 2021-03-19 DIAGNOSIS — Z801 Family history of malignant neoplasm of trachea, bronchus and lung: Secondary | ICD-10-CM

## 2021-03-19 DIAGNOSIS — D696 Thrombocytopenia, unspecified: Secondary | ICD-10-CM | POA: Diagnosis not present

## 2021-03-19 DIAGNOSIS — Z87891 Personal history of nicotine dependence: Secondary | ICD-10-CM

## 2021-03-19 DIAGNOSIS — R161 Splenomegaly, not elsewhere classified: Secondary | ICD-10-CM | POA: Diagnosis not present

## 2021-03-19 LAB — CBC WITH DIFFERENTIAL (CANCER CENTER ONLY)
Abs Immature Granulocytes: 0.01 10*3/uL (ref 0.00–0.07)
Basophils Absolute: 0 10*3/uL (ref 0.0–0.1)
Basophils Relative: 0 %
Eosinophils Absolute: 0.1 10*3/uL (ref 0.0–0.5)
Eosinophils Relative: 2 %
HCT: 38.7 % — ABNORMAL LOW (ref 39.0–52.0)
Hemoglobin: 12.5 g/dL — ABNORMAL LOW (ref 13.0–17.0)
Immature Granulocytes: 0 %
Lymphocytes Relative: 15 %
Lymphs Abs: 0.4 10*3/uL — ABNORMAL LOW (ref 0.7–4.0)
MCH: 27.1 pg (ref 26.0–34.0)
MCHC: 32.3 g/dL (ref 30.0–36.0)
MCV: 83.8 fL (ref 80.0–100.0)
Monocytes Absolute: 0.4 10*3/uL (ref 0.1–1.0)
Monocytes Relative: 15 %
Neutro Abs: 1.7 10*3/uL (ref 1.7–7.7)
Neutrophils Relative %: 68 %
Platelet Count: 56 10*3/uL — ABNORMAL LOW (ref 150–400)
RBC: 4.62 MIL/uL (ref 4.22–5.81)
RDW: 15.5 % (ref 11.5–15.5)
WBC Count: 2.6 10*3/uL — ABNORMAL LOW (ref 4.0–10.5)
nRBC: 0 % (ref 0.0–0.2)

## 2021-03-19 LAB — CMP (CANCER CENTER ONLY)
ALT: 67 U/L — ABNORMAL HIGH (ref 0–44)
AST: 63 U/L — ABNORMAL HIGH (ref 15–41)
Albumin: 4.1 g/dL (ref 3.5–5.0)
Alkaline Phosphatase: 375 U/L — ABNORMAL HIGH (ref 38–126)
Anion gap: 11 (ref 5–15)
BUN: 16 mg/dL (ref 8–23)
CO2: 27 mmol/L (ref 22–32)
Calcium: 9.8 mg/dL (ref 8.9–10.3)
Chloride: 104 mmol/L (ref 98–111)
Creatinine: 0.9 mg/dL (ref 0.61–1.24)
GFR, Estimated: >60 mL/min (ref >60)
Glucose, Bld: 149 mg/dL — ABNORMAL HIGH (ref 70–99)
Potassium: 4.5 mmol/L (ref 3.5–5.1)
Sodium: 142 mmol/L (ref 135–145)
Total Bilirubin: 0.6 mg/dL (ref 0.3–1.2)
Total Protein: 7.4 g/dL (ref 6.5–8.1)

## 2021-03-19 LAB — HEPATITIS B SURFACE ANTIGEN: Hepatitis B Surface Ag: NONREACTIVE

## 2021-03-19 LAB — HEPATITIS B SURFACE ANTIBODY,QUALITATIVE: Hep B S Ab: NONREACTIVE

## 2021-03-19 LAB — SAVE SMEAR(SSMR), FOR PROVIDER SLIDE REVIEW

## 2021-03-19 LAB — HEPATITIS C ANTIBODY: HCV Ab: NONREACTIVE

## 2021-03-19 LAB — FOLATE: Folate: 32.5 ng/mL (ref >5.9)

## 2021-03-19 LAB — HIV ANTIBODY (ROUTINE TESTING W REFLEX): HIV Screen 4th Generation wRfx: NONREACTIVE

## 2021-03-19 LAB — VITAMIN B12: Vitamin B-12: 712 pg/mL (ref 180–914)

## 2021-03-19 LAB — HEPATITIS B CORE ANTIBODY, TOTAL: Hep B Core Total Ab: NONREACTIVE

## 2021-03-19 NOTE — Progress Notes (Signed)
[place] [phone]   Fax:([phone] [place] NOTE  Patient Care Team: Leonard Downing, MD as PCP - General (Family Medicine)  Hematological/Oncological History 1) Labs from PCP, Dr. Arelia Sneddon -02/18/2021: WBC 2.7 (L), Hgb 13.0, Plt 55 (L), ALC 0.5 (L), Alk Phos 466 (H), AST 64 (H), ALT 60 (H) -02/27/2021: WBC 2.4 (L), Hgb 12.2 (L), Plt 45 (L), ALC 0.5 (L), Alk Phos 389 (H), AST 61 (H), ALT 55 (H)  2) 03/19/2021: Establish care with Dede Query PA-C  CHIEF COMPLAINTS/PURPOSE OF CONSULTATION:  "Thrombocytopenia"  HISTORY OF PRESENTING ILLNESS:  [name] Ford 60 y.o. male with medical history significant for T2DM, HTN, hyperlipidemia, coronary artery diease, cirrhosis, polysubstance abuse. Patient is unaccompanied for this visit.   On exam today, [name] Ford reports chronic fatigue but still continues to complete his ADLs on his own. He has a good appetite and denies any recent weight changes. He denies any nausea, vomiting or abdominal pain. He reports occasional episodes of loose stools. He reports that he bruises easily but denies any active bleeding. He has diffuse joint pain and body aches. He denies fevers, chills, night sweats, shortness of breath, chest pain or cough. He has no other complaints. Rest of the 10 point ROS is below.   MEDICAL HISTORY:  Past Medical History:  Diagnosis Date   CAD (coronary artery disease)    Nonobstructive by cath in [facility] reportedly in 2010 with 50% RCA and 50% ramus . Last adm 03/2009 with CP/mildly elevated troponin recommended for cath/echo but patient left AMA   Cirrhosis ([place])    History of abnormal LFTs. Hx of biopsy per patient. Pt also states negative for Hep C in the past.   Diabetes mellitus    Gallbladder disease    Hyperlipidemia    Polysubstance abuse (HCC)    Hx of cocaine, tobacco, marijuana use    SURGICAL HISTORY: Past Surgical History:  Procedure Laterality Date   KNEE  SURGERY     L knee ACL/meniscus repair 2002    SOCIAL HISTORY: Social History   Socioeconomic History   Marital status: Divorced    Spouse name: Not on file   Number of children: Not on file   Years of education: Not on file   Highest education level: Not on file  Occupational History   Occupation: pizza delivery  Tobacco Use   Smoking status: Former    Packs/day: 1.00    Years: 35.00    Pack years: 35.00    Types: Cigarettes   Smokeless tobacco: Never  Substance and Sexual Activity   Alcohol use: No   Drug use: Not Currently    Frequency: 1.0 times per week    Types: Cocaine, Marijuana    Comment: Pt u/a to quantify amt of cocaine. 30 year h/o marijuana use, 3-4 times per week    Sexual activity: Not on file  Other Topics Concern   Not on file  Social History Narrative   Lives alone. Delivers pizza.   Social Determinants of Health   Financial Resource Strain: Not on file  Food Insecurity: Not on file  Transportation Needs: Not on file  Physical Activity: Not on file  Stress: Not on file  Social Connections: Not on file  Intimate Partner Violence: Not on file    FAMILY HISTORY: Family History  Problem Relation Age of Onset   Breast cancer Mother    Lung cancer Mother        smoking  history   Stroke Father        32s   Hypertension Father    Alzheimer's disease Father     ALLERGIES:  has No Known Allergies.  MEDICATIONS:  Current Outpatient Medications  Medication Sig Dispense Refill   Continuous Blood Gluc Receiver (FREESTYLE LIBRE 2 READER) DEVI USE DEVICE TO TEST 4 TIMES DAILY AND AS NEEDED     Continuous Blood Gluc Sensor (FREESTYLE LIBRE 2 SENSOR) MISC SMARTSIG:Via Meter 4 Times Daily PRN     FREESTYLE PRECISION NEO TEST test strip      glimepiride (AMARYL) 4 MG tablet Take 4 mg by mouth 2 (two) times daily.     metFORMIN (GLUCOPHAGE) 1000 MG tablet Take 1,000 mg by mouth 2 (two) times daily with a meal.     pioglitazone (ACTOS) 45 MG tablet Take  45 mg by mouth daily.     No current facility-administered medications for this visit.    REVIEW OF SYSTEMS:   Constitutional: ( - ) fevers, ( - )  chills , ( - ) night sweats Eyes: ( - ) blurriness of vision, ( - ) double vision, ( - ) watery eyes Ears, nose, mouth, throat, and face: ( - ) mucositis, ( - ) sore throat Respiratory: ( - ) cough, ( - ) dyspnea, ( - ) wheezes Cardiovascular: ( - ) palpitation, ( - ) chest discomfort, ( - ) lower extremity swelling Gastrointestinal:  ( - ) nausea, ( - ) heartburn, ( - ) change in bowel habits Skin: ( - ) abnormal skin rashes Lymphatics: ( - ) new lymphadenopathy, ( - ) easy bruising Neurological: ( - ) numbness, ( - ) tingling, ( - ) new weaknesses Behavioral/Psych: ( - ) mood change, ( - ) new changes  All other systems were reviewed with the patient and are negative.  PHYSICAL EXAMINATION: ECOG PERFORMANCE STATUS: 1 - Symptomatic but completely ambulatory  Vitals:   03/19/21 1358  BP: (!) 148/74  Pulse: 69  Resp: 17  Temp: 98.9 F (37.2 C)  SpO2: 99%   Filed Weights   03/19/21 1358  Weight: 135 lb 11.2 oz (61.6 kg)    GENERAL: well appearing male in NAD  SKIN: skin color, texture, turgor are normal, no rashes or significant lesions EYES: conjunctiva are pink and non-injected, sclera clear OROPHARYNX: no exudate, no erythema; lips, buccal mucosa, and tongue normal  NECK: supple, non-tender LYMPH:  no palpable lymphadenopathy in the cervical  or supraclavicular lymph nodes.  LUNGS: clear to auscultation and percussion with normal breathing effort HEART: regular rate & rhythm and no murmurs and no lower extremity edema ABDOMEN: soft, non-tender, non-distended, normal bowel sounds Musculoskeletal: no cyanosis of digits and no clubbing  PSYCH: alert & oriented x 3, fluent speech NEURO: no focal motor/sensory deficits  LABORATORY DATA:  I have reviewed the data as listed CBC Latest Ref Rng & Units 03/19/2021 09/02/2011  09/01/2011  WBC 4.0 - 10.5 K/uL 2.6(L) 7.1 6.1  Hemoglobin 13.0 - 17.0 g/dL 12.5(L) 12.1(L) 13.0  Hematocrit 39.0 - 52.0 % 38.7(L) 36.3(L) 41.6  Platelets 150 - 400 K/uL 56(L) 116(L) 111(L)    CMP Latest Ref Rng & Units 03/19/2021 09/03/2011 09/02/2011  Glucose 70 - 99 mg/dL 149(H) 110(H) 159(H)  BUN 8 - 23 mg/dL _0 Creatinine 0.61 - 1.24 mg/dL 0.90 0.91 0.87  Sodium 135 - 145 mmol/L 142 138 135  Potassium 3.5 - 5.1 mmol/L 4.5 4.1 4.0  Chloride 98 -  111 mmol/L 104 100 98  CO2 22 - 32 mmol/L _0 Calcium 8.9 - 10.3 mg/dL 9.8 9.7 9.6  Total Protein 6.5 - 8.1 g/dL 7.4 - 6.9  Total Bilirubin 0.3 - 1.2 mg/dL 0.6 - 0.5  Alkaline Phos 38 - 126 U/L 375(H) - 833(H)  AST 15 - 41 U/L 63(H) - 62(H)  ALT 0 - 44 U/L 67(H) - 89(H)    ASSESSMENT & PLAN [name] Ford is a 60 y.o. male who presents for evaluation for thrombocytopenia. I reviewed potential etiologies for thrombocytopenia including liver disease, splenomegaly, infectious processes, nutritional anemias, immune mediated and bone marrow disorders. The likely cause is cirrhosis with associated splenomegaly. Patient is currently not under the care of a hepatology. He had a CT abdomen/pelvis on 01/15/2015 that showed cirrhosis, splenomegaly and upper abdominal varices.   Patient will proceed with labs today to rule out other causes. We will check CBC, CMP, Vitamin B12, MMA, folate, Hepatitis B and C serologies, HIV serology and save smear.   #Thrombocytopenia: --Secondary to cirrhosis and splenomegaly. Not currently under the care of GI/ hepatologist.  --Most recent CT imaging from 01/15/2015 showed cirrhosis and splenomegaly.  --Labs today to rule out other causes by checking CBC, CMP, Vitamin B12, MMA, folate, Hepatitis B and C serologies, HIV serology and save smear. --Placed referral to  GI since patient is not able to travel to the [facility] where he was previously seen.  --RTC based on above workup.   Orders Placed This  Encounter  Procedures   CBC with Differential ([place] Only)    Standing Status:   Future    Number of Occurrences:   1    Standing Expiration Date:   03/19/2022   CMP ([place] only)    Standing Status:   Future    Number of Occurrences:   1    Standing Expiration Date:   03/19/2022   Save Smear ([facility])    Standing Status:   Future    Number of Occurrences:   1    Standing Expiration Date:   03/19/2022   Vitamin B12    Standing Status:   Future    Number of Occurrences:   1    Standing Expiration Date:   03/19/2022   Folate, Serum    Standing Status:   Future    Number of Occurrences:   1    Standing Expiration Date:   03/19/2022   Hepatitis B core antibody, total    Standing Status:   Future    Number of Occurrences:   1    Standing Expiration Date:   03/19/2022   Hepatitis B surface antigen    Standing Status:   Future    Number of Occurrences:   1    Standing Expiration Date:   03/19/2022   Hepatitis B surface antibody    Standing Status:   Future    Number of Occurrences:   1    Standing Expiration Date:   03/19/2022   Hepatitis C antibody    Standing Status:   Future    Number of Occurrences:   1    Standing Expiration Date:   03/19/2022   HIV antibody (with reflex)    Standing Status:   Future    Number of Occurrences:   1    Standing Expiration Date:   03/19/2022    All questions were answered. The patient knows to call the clinic with any problems, questions or  concerns.  I have spent a total of 60 minutes minutes of face-to-face and non-face-to-face time, preparing to see the patient, obtaining and/or reviewing separately obtained history, performing a medically appropriate examination, counseling and educating the patient, ordering tests, referring with other health care professionals, documenting clinical information in the electronic health record, and care coordination.   Dede Query, PA-C Department of Hematology/Oncology [place] at  [facility] Phone: [phone]

## 2021-03-26 ENCOUNTER — Telehealth: Payer: Self-pay | Admitting: Physician Assistant

## 2021-03-26 ENCOUNTER — Telehealth: Payer: Self-pay | Admitting: *Deleted

## 2021-03-26 DIAGNOSIS — D696 Thrombocytopenia, unspecified: Secondary | ICD-10-CM

## 2021-03-26 DIAGNOSIS — K7469 Other cirrhosis of liver: Secondary | ICD-10-CM

## 2021-03-26 NOTE — Telephone Encounter (Signed)
I called [name] Ford to review the lab results from 03/19/2021. Hepatitis B, Hepatitis C and HIV serologies were unremarkable. There was no evidence of vitamin B12 or folate deficiencies. CBC showed platelet count of 56K which was slightly improved from 45K on 02/27/2021, Hgb stable at 12.5, and WBC decreased at 2.6.   I explained that likely cause of thrombocytopenia is cirrhosis with splenomegaly. I followed up on my referral placed for GI since patient was last seen by hepatologist in 2017 at [facility]. Patient requested if we can order the abdominal US first before seeing the GI team. I think that is reasonable so I will order [place] next week and call patient with the results.

## 2021-03-26 NOTE — Telephone Encounter (Signed)
Received vm message from pt inquiring about his lab results from 03/19/21 He is requesting call back from Georga Kaufmann, [place]

## 2021-03-28 ENCOUNTER — Ambulatory Visit (HOSPITAL_COMMUNITY): Payer: PPO

## 2021-04-08 ENCOUNTER — Other Ambulatory Visit (INDEPENDENT_AMBULATORY_CARE_PROVIDER_SITE_OTHER): Payer: PPO

## 2021-04-08 ENCOUNTER — Encounter: Payer: Self-pay | Admitting: Gastroenterology

## 2021-04-08 ENCOUNTER — Ambulatory Visit: Payer: PPO | Admitting: Gastroenterology

## 2021-04-08 VITALS — BP 130/60 | HR 82 | Ht 70.0 in | Wt 137.0 lb

## 2021-04-08 DIAGNOSIS — K745 Biliary cirrhosis, unspecified: Secondary | ICD-10-CM | POA: Diagnosis not present

## 2021-04-08 DIAGNOSIS — R413 Other amnesia: Secondary | ICD-10-CM

## 2021-04-08 DIAGNOSIS — K7460 Unspecified cirrhosis of liver: Secondary | ICD-10-CM | POA: Diagnosis not present

## 2021-04-08 DIAGNOSIS — Z1211 Encounter for screening for malignant neoplasm of colon: Secondary | ICD-10-CM | POA: Diagnosis not present

## 2021-04-08 DIAGNOSIS — Z23 Encounter for immunization: Secondary | ICD-10-CM

## 2021-04-08 LAB — PROTIME-INR
INR: 1.1 ratio — ABNORMAL HIGH (ref 0.8–1.0)
Prothrombin Time: 12.1 s (ref 9.6–13.1)

## 2021-04-08 LAB — AMMONIA: Ammonia: 28 umol/L (ref 11–35)

## 2021-04-08 LAB — VITAMIN D 25 HYDROXY (VIT D DEFICIENCY, FRACTURES): VITD: 44.57 ng/mL (ref 30.00–100.00)

## 2021-04-08 MED ORDER — HEPATITIS B VAC RECOMBINANT 5 MCG/0.5ML IJ SUSP
0.5000 mL | Freq: Once | INTRAMUSCULAR | Status: AC
  Administered 2021-04-08: 0.5 mL via INTRAMUSCULAR

## 2021-04-08 MED ORDER — LACTULOSE 20 GM/30ML PO SOLN: 10.0000 mL | Freq: Every day | ORAL | 0 refills | Status: DC

## 2021-04-08 NOTE — Patient Instructions (Signed)
If you are age 62 or older, your body mass index should be between 23-30. Your Body mass index is 19.66 kg/m. If this is out of the aforementioned range listed, please consider follow up with your Primary Care Provider.  If you are age 44 or younger, your body mass index should be between 19-25. Your Body mass index is 19.66 kg/m. If this is out of the aformentioned range listed, please consider follow up with your Primary Care Provider.   Your provider has requested that you go to the basement level for lab work before leaving today. Press "B" on the elevator. The lab is located at the first door on the left as you exit the elevator.  You will be contacted by [facility] in the next 2 days to arrange a RUQ Ultra sound.  The number on your caller ID will be [phone], please answer when they call.  If you have not heard from them in 2 days please call [phone] to schedule.     You have been scheduled for a bone density test on 04/08/21 at now. Please arrive 15 minutes prior to your scheduled appointment to radiology on the basement floor of [facility] location for this test. If you need to cancel or reschedule for any reason, please contact radiology at [phone].  Preparation for test is as follows:  If you are taking calcium, discontinue this 24-48 hours prior to your appointment.  Wear pants with an elastic waistband (or without any metal such as a zipper).  Do not wear an underwire bra.  We do have gowns if you are unable to find appropriate clothing without metal.  Please bring a list of all current medications.   The [place] [facility] providers would like to encourage you to use [facility] to communicate with providers for non-urgent requests or questions.  Due to long hold times on the telephone, sending your provider a message by [facility] may be a faster and more efficient way to get a response.  Please allow 48 business hours for a response.  Please  remember that this is for non-urgent requests.   Due to recent changes in healthcare laws, you may see the results of your imaging and laboratory studies on MyChart before your provider has had a chance to review them.  We understand that in some cases there may be results that are confusing or concerning to you. Not all laboratory results come back in the same time frame and the provider may be waiting for multiple results in order to interpret others.  Please give [place] 48 hours in order for your provider to thoroughly review all the results before contacting the office for clarification of your results.    It was a pleasure to see you today!  Thank you for trusting me with your gastrointestinal care!    [place] E. Tomasa Rand, MD

## 2021-04-08 NOTE — Progress Notes (Signed)
HPI : [name] Ford is a very pleasant 61 year old male with a history of cirrhosis referred to [place] to reestablish GI care of his cirrhosis.  The patient states that he was diagnosed with cirrhosis in 2008.  He says he was never told why he had cirrhosis.  He did undergo a liver biopsy around this time.  He denies any history of heavy alcohol use.  He does have a history of diabetes, but no history of obesity.  He does not drink alcohol at all currently and his last drink was many years ago.  He denies any family history of liver disease.  He reports that his diabetes has historically been very well controlled ever since his diagnosis.  He was previously followed by Dr. Joellyn Rued in [place], but he has not seen him in quite some time. The patient reports many vague nonspecific complaints which may or may not be related to his cirrhosis.  These include generalized musculoskeletal pain, back pain, arthralgias, myalgias, etc.  He takes Ibuprofen on occasion for these pains.  He reports generalized fatigue and a lack of strength and energy.  He occasionally feels dizzy and off balance.  He also reports cognitive difficulties manifested as frequently forgetting what he was doing and being easily distracted and not remembering what he supposed to do.  He gives examples of him opening the refrigerator and not remembering what he was looking for and going into a room and not remembering why.  He also reports difficulty with word finding. He reports regular bowel movements most the time.  Sometimes he does have issues with diarrhea with urgency.  He used to have significant heartburn, but this has not been a problem for him recently he has occasional dysphagia, but this is very rare.  His weight has been stable.  He is not sure if he is ever had an upper or lower endoscopy.  Recent liver panel shows mildly elevated AST and ALT, normal T bili and an elevated alkaline phosphatase at 375.  In 2012 in 2013, his  alkaline phosphatase had been as high as 900.  CBC is notable for thrombocytopenia (50s), leukopenia (WBC 2.5) and mild anemia (hemoglobin 12.5). He has no available imaging for review other than a right upper quadrant ultrasound in 2010 which showed a cirrhotic appearing liver    Past Medical History:  Diagnosis Date   CAD (coronary artery disease)    Nonobstructive by cath in [facility] reportedly in 2010 with 50% RCA and 50% ramus . Last adm 03/2009 with CP/mildly elevated troponin recommended for cath/echo but patient left AMA   Cirrhosis (HCC)    History of abnormal LFTs. Hx of biopsy per patient. Pt also states negative for Hep C in the past.   Diabetes mellitus    Gallbladder disease    Hyperlipidemia    Polysubstance abuse (HCC)    Hx of cocaine, tobacco, marijuana use     Past Surgical History:  Procedure Laterality Date   KNEE SURGERY     L knee ACL/meniscus repair 2002   Family History  Problem Relation Age of Onset   Breast cancer Mother    Lung cancer Mother        smoking history   Stroke Father        60s   Hypertension Father    Alzheimer's disease Father    Social History   Tobacco Use   Smoking status: Former    Packs/day: 1.00    Years: 35.00  Pack years: 35.00    Types: Cigarettes   Smokeless tobacco: Never  Substance Use Topics   Alcohol use: No   Drug use: Not Currently    Frequency: 1.0 times per week    Types: Cocaine, Marijuana    Comment: Pt u/a to quantify amt of cocaine. 30 year h/o marijuana use, 3-4 times per week    Current Outpatient Medications  Medication Sig Dispense Refill   Continuous Blood Gluc Receiver (FREESTYLE LIBRE 2 READER) DEVI USE DEVICE TO TEST 4 TIMES DAILY AND AS NEEDED     Continuous Blood Gluc Sensor (FREESTYLE LIBRE 2 SENSOR) MISC SMARTSIG:Via Meter 4 Times Daily PRN     FREESTYLE PRECISION NEO TEST test strip      glimepiride (AMARYL) 4 MG tablet Take 4 mg by mouth 2 (two) times daily.     metFORMIN  (GLUCOPHAGE) 1000 MG tablet Take 1,000 mg by mouth 2 (two) times daily with a meal.     pioglitazone (ACTOS) 45 MG tablet Take 45 mg by mouth daily.     No current facility-administered medications for this visit.   No Known Allergies   Review of Systems: All systems reviewed and negative except where noted in HPI.    No results found.  Physical Exam: Ht 5\' 10"  (1.778 m)   Wt 137 lb (62.1 kg)   BMI 19.66 kg/m  Constitutional: Pleasant,well-developed, Caucasian male in no acute distress. HEENT: Normocephalic and atraumatic. Conjunctivae are normal. No scleral icterus.  Mallampati 2 Cardiovascular: Normal rate, regular rhythm.  Pulmonary/chest: Effort normal and breath sounds normal. No wheezing, rales or rhonchi. Abdominal: Soft, nondistended, nontender. Bowel sounds active throughout. There are no masses palpable.  Firm liver edge palpated below costal margin Extremities: no edema Lymphadenopathy: No cervical adenopathy noted. Neurological: Alert and oriented to person place and time.  No asterixis Skin: Skin is warm and dry. No rashes noted.  No jaundice Psychiatric: Normal mood and affect. Behavior is normal.  CBC    Component Value Date/Time   WBC 2.6 (L) 03/19/2021 1458   WBC 7.1 09/02/2011 0320   RBC 4.62 03/19/2021 1458   HGB 12.5 (L) 03/19/2021 1458   HCT 38.7 (L) 03/19/2021 1458   PLT 56 (L) 03/19/2021 1458   MCV 83.8 03/19/2021 1458   MCH 27.1 03/19/2021 1458   MCHC 32.3 03/19/2021 1458   RDW 15.5 03/19/2021 1458   LYMPHSABS 0.4 (L) 03/19/2021 1458   MONOABS 0.4 03/19/2021 1458   EOSABS 0.1 03/19/2021 1458   BASOSABS 0.0 03/19/2021 1458    CMP     Component Value Date/Time   NA 142 03/19/2021 1458   K 4.5 03/19/2021 1458   CL 104 03/19/2021 1458   CO2 27 03/19/2021 1458   GLUCOSE 149 (H) 03/19/2021 1458   BUN 16 03/19/2021 1458   CREATININE 0.90 03/19/2021 1458   CALCIUM 9.8 03/19/2021 1458   PROT 7.4 03/19/2021 1458   ALBUMIN 4.1 03/19/2021 1458    AST 63 (H) 03/19/2021 1458   ALT 67 (H) 03/19/2021 1458   ALKPHOS 375 (H) 03/19/2021 1458   BILITOT 0.6 03/19/2021 1458   GFRNONAA >60 03/19/2021 1458   GFRAA >90 09/03/2011 0545        Total Protein 6.5 - 8.1 g/dL 7.4   6.9 R   7.6 R  6.5 R    Albumin 3.5 - 5.0 g/dL 4.1   2.9 Low  R   3.4 Low  R  2.9 Low  R  AST 15 - 41 U/L 63 High    62 High  R   59 High  R  77 High  R    ALT 0 - 44 U/L 67 High    89 High  R   96 High  R  88 High  R    Alkaline Phosphatase 38 - 126 U/L 375 High    833 High  R   919 High  R  948 High  R    Total Bilirubin 0.3 - 1.2 mg/dL 0.6   0.5   0.7  0.8       ASSESSMENT AND PLAN: 60 year old male with longstanding compensated cirrhosis of unclear etiology.  Given his very high alkaline phosphatase levels, I am suspicious for a biliary cirrhosis such as PSC or PBC.  We will request the records from Dr. West Pugh office so as to avoid unnecessary repeated work-up.  We had a long discussion of the natural history of cirrhosis and the potential complications that can arise.  There does not appear to be a clinical history of decompensation such as ascites, SBP or bleeding varices.  I am somewhat concerned about hepatic encephalopathy given the patient's report of forgetfulness and memory difficulties.  He has no asterixis on exam which is reassuring.  He also has a strong family history of dementia and Alzheimer's.  I suggested we try some lactulose to see if there is any improvement in his memory difficulties.  He typically already has 3 bowel movements a day, so I suggested we just try taking it once a day to start out with.  We will also get a baseline ammonia level.  I would also like to get a bone density level, and zinc and vitamin D levels.  We do not have a recent PT/INR.  Recent labs indicate that he is not immune to hepatitis B, so I recommended he receive hep B vaccine series.  He is also due for variceal and HCC screening.  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  Recommended he take less than 3 g daily, as this is likely safer than NSAIDs. - Continue complete abstinence from alcohol  Questionable hepatic encephalopathy - Check ammonia level - Start lactulose 15-74mL daily  Colon cancer screening - Patient states he had a stool based screening test a few months ago by Dr. Jeannetta Nap - Obtain results from fecal occult blood test to further guide colon cancer screening  The details, risks (including bleeding, perforation, infection, missed lesions, medication reactions and possible hospitalization or surgery if complications occur), benefits, and alternatives to EGD with possible biopsy and possible dilation were discussed with the patient and he consents to proceed.    Nghia Mcentee E. Tomasa Rand, MD [facility]     CC:  Kaleen Mask, *

## 2021-04-10 ENCOUNTER — Telehealth: Payer: Self-pay

## 2021-04-10 LAB — ZINC: Zinc: 77 ug/dL (ref 60–130)

## 2021-04-10 NOTE — Telephone Encounter (Signed)
Called patient to let him know he has been scheduled for a RUQ Ultrasound 04/15/21 @9 :30am to arruve at 9:15am and NPO after midnight the night before.

## 2021-04-15 ENCOUNTER — Other Ambulatory Visit: Payer: Self-pay

## 2021-04-15 ENCOUNTER — Ambulatory Visit (HOSPITAL_COMMUNITY)
Admission: RE | Admit: 2021-04-15 | Discharge: 2021-04-15 | Disposition: A | Discharge status: Home / Self Care | Payer: PPO | Source: Ambulatory Visit | Attending: Gastroenterology | Admitting: Gastroenterology

## 2021-04-15 DIAGNOSIS — K8020 Calculus of gallbladder without cholecystitis without obstruction: Secondary | ICD-10-CM | POA: Diagnosis not present

## 2021-04-15 DIAGNOSIS — K745 Biliary cirrhosis, unspecified: Secondary | ICD-10-CM | POA: Insufficient documentation

## 2021-04-28 ENCOUNTER — Encounter: Payer: Self-pay | Admitting: Gastroenterology

## 2021-04-28 ENCOUNTER — Ambulatory Visit (AMBULATORY_SURGERY_CENTER): Payer: PPO | Admitting: Gastroenterology

## 2021-04-28 ENCOUNTER — Other Ambulatory Visit: Payer: Self-pay

## 2021-04-28 VITALS — BP 110/71 | HR 76 | Temp 98.4°F | Resp 13 | Ht 70.0 in | Wt 137.0 lb

## 2021-04-28 DIAGNOSIS — K7460 Unspecified cirrhosis of liver: Secondary | ICD-10-CM | POA: Diagnosis not present

## 2021-04-28 DIAGNOSIS — I8510 Secondary esophageal varices without bleeding: Secondary | ICD-10-CM | POA: Diagnosis not present

## 2021-04-28 DIAGNOSIS — K745 Biliary cirrhosis, unspecified: Secondary | ICD-10-CM

## 2021-04-28 DIAGNOSIS — K319 Disease of stomach and duodenum, unspecified: Secondary | ICD-10-CM

## 2021-04-28 DIAGNOSIS — K766 Portal hypertension: Secondary | ICD-10-CM | POA: Diagnosis not present

## 2021-04-28 MED ORDER — SODIUM CHLORIDE 0.9 % IV SOLN: 500.0000 mL | Freq: Once | INTRAVENOUS | Status: DC

## 2021-04-28 MED ORDER — PROPRANOLOL HCL 10 MG PO TABS: ORAL_TABLET | ORAL | 3 refills | Status: DC

## 2021-04-28 NOTE — Progress Notes (Signed)
DT - vs ° ° °

## 2021-04-28 NOTE — Progress Notes (Signed)
Called to room to assist during endoscopic procedure.  Patient ID and intended procedure confirmed with present staff. Received instructions for my participation in the procedure from the performing physician.  

## 2021-04-28 NOTE — Progress Notes (Signed)
History and Physical Interval Note:  No changes in patient's medical history or symptoms since his clinic visit Sept 6th.  04/28/2021 10:13 AM  [name] Ford  has presented today for endoscopic procedure(s), with the diagnosis of  Encounter Diagnosis  Name Primary?   Biliary cirrhosis (HCC) Yes  .  The various methods of evaluation and treatment have been discussed with the patient and/or family. After consideration of risks, benefits and other options for treatment, the patient has consented to  the endoscopic procedure(s).   The patient's history has been reviewed, patient examined, no change in status, stable for endoscopic procedure(s).  I have reviewed the patient's chart and labs.  Questions were answered to the patient's satisfaction.     [name] E. Tomasa Rand, MD [facility]

## 2021-04-28 NOTE — Op Note (Signed)
[facility] Patient Name: [name] Ford Procedure Date: 04/28/2021 10:04 AM MRN: [id number] Endoscopist: Lorin Picket E. Tomasa Rand , MD Age: 60 Referring MD:  Date of Birth: 1960-08-12 Gender: Male Account #: [id number] Procedure:                Upper GI endoscopy Indications:              Cirrhosis rule out esophageal varices Medicines:                Monitored Anesthesia Care Procedure:                Pre-Anesthesia Assessment:                           - Prior to the procedure, a History and Physical                            was performed, and patient medications and                            allergies were reviewed. The patient's tolerance of                            previous anesthesia was also reviewed. The risks                            and benefits of the procedure and the sedation                            options and risks were discussed with the patient.                            All questions were answered, and informed consent                            was obtained. Prior Anticoagulants: The patient has                            taken no previous anticoagulant or antiplatelet                            agents. ASA Grade Assessment: III - A patient with                            severe systemic disease. After reviewing the risks                            and benefits, the patient was deemed in                            satisfactory condition to undergo the procedure.                           After obtaining informed consent, the endoscope was  passed under direct vision. Throughout the                            procedure, the patient's blood pressure, pulse, and                            oxygen saturations were monitored continuously. The                            Endoscope was introduced through the mouth, and                            advanced to the second part of duodenum. The upper                            GI endoscopy  was accomplished without difficulty.                            The patient tolerated the procedure well. Scope In: Scope Out: Findings:                 The examined portions of the nasopharynx,                            oropharynx and larynx were normal.                           Grade II varices were found in the middle third of                            the esophagus and in the lower third of the                            esophagus. They were large in size.                           The exam of the esophagus was otherwise normal.                           Moderate, diffuse portal hypertensive gastropathy                            was found in the entire examined stomach. Biopsies                            were taken with a cold forceps for histology.                            Estimated blood loss was minimal.                           The exam of the stomach was otherwise normal.  The examined duodenum was normal. Complications:            No immediate complications. Estimated Blood Loss:     Estimated blood loss was minimal. Impression:               - The examined portions of the nasopharynx,                            oropharynx and larynx were normal.                           - Grade II esophageal varices.                           - Portal hypertensive gastropathy. Biopsied.                           - Normal examined duodenum. Recommendation:           - Patient has a contact number available for                            emergencies. The signs and symptoms of potential                            delayed complications were discussed with the                            patient. Return to normal activities tomorrow.                            Written discharge instructions were provided to the                            patient.                           - Resume previous diet.                           - Start propranolol 10 mg PO TID with dosage                             titrated by the heart rate (resting HR 55-65).                           - Await pathology results.                           - Return to GI clinic at appointment to be                            scheduled. Aarya Quebedeaux E. Tomasa Rand, MD 04/28/2021 10:54:53 AM This report has been signed electronically.

## 2021-04-28 NOTE — Patient Instructions (Addendum)
YOU HAD AN ENDOSCOPIC PROCEDURE TODAY AT THE [facility]:   Refer to the procedure report that was given to you for any specific questions about what was found during the examination.  If the procedure report does not answer your questions, please call your gastroenterologist to clarify.  If you requested that your care partner not be given the details of your procedure findings, then the procedure report has been included in a sealed envelope for you to review at your convenience later.  YOU SHOULD EXPECT: Some feelings of bloating in the abdomen. Passage of more gas than usual.  Walking can help get rid of the air that was put into your GI tract during the procedure and reduce the bloating. If you had a lower endoscopy (such as a colonoscopy or flexible sigmoidoscopy) you may notice spotting of blood in your stool or on the toilet paper. If you underwent a bowel prep for your procedure, you may not have a normal bowel movement for a few days.  Please Note:  You might notice some irritation and congestion in your nose or some drainage.  This is from the oxygen used during your procedure.  There is no need for concern and it should clear up in a day or so.  SYMPTOMS TO REPORT IMMEDIATELY:  Following upper endoscopy (EGD)  Vomiting of blood or coffee ground material  New chest pain or pain under the shoulder blades  Painful or persistently difficult swallowing  New shortness of breath  Fever of 100F or higher  Black, tarry-looking stools  For urgent or emergent issues, a gastroenterologist can be reached at any hour by calling [phone]. Do not use MyChart messaging for urgent concerns.    DIET:  We do recommend a small meal at first, but then you may proceed to your regular diet.  Drink plenty of fluids but you should avoid alcoholic beverages for 24 hours.  MEDICATIONS: Continue present medications. Start Propranolol 10 mg by mouth three times daily with dosage titrated  (adjusted) by your heart rate (resting heart rate of 55-65).  FOLLOW UP: Follow up with Dr. Tomasa Rand in his office.  Please see handouts given to you by your recovery nurse.  Thank you for allowing [place] to provide for your healthcare needs today.  ACTIVITY:  You should plan to take it easy for the rest of today and you should NOT DRIVE or use heavy machinery until tomorrow (because of the sedation medicines used during the test).    FOLLOW UP: Our staff will call the number listed on your records 48-72 hours following your procedure to check on you and address any questions or concerns that you may have regarding the information given to you following your procedure. If we do not reach you, we will leave a message.  We will attempt to reach you two times.  During this call, we will ask if you have developed any symptoms of COVID 19. If you develop any symptoms (ie: fever, flu-like symptoms, shortness of breath, cough etc.) before then, please call [phone].  If you test positive for Covid 19 in the 2 weeks post procedure, please call and report this information to [place].    If any biopsies were taken you will be contacted by phone or by letter within the next 1-3 weeks.  Please call us at [phone] if you have not heard about the biopsies in 3 weeks.    SIGNATURES/CONFIDENTIALITY: You and/or your care partner have signed paperwork which  will be entered into your electronic medical record.  These signatures attest to the fact that that the information above on your After Visit Summary has been reviewed and is understood.  Full responsibility of the confidentiality of this discharge information lies with you and/or your care-partner.

## 2021-04-28 NOTE — Progress Notes (Signed)
To PACU, VSS. Report to [email or web address] 

## 2021-04-30 ENCOUNTER — Telehealth: Payer: Self-pay

## 2021-04-30 NOTE — Telephone Encounter (Signed)
  Follow up Call-  Call back number 04/28/2021  Post procedure Call Back phone  # #[phone] cell  Permission to leave phone message Yes  Some recent data might be hidden     Patient questions:  Do you have a fever, pain , or abdominal swelling? No. Pain Score  0 *  Have you tolerated food without any problems? Yes.    Have you been able to return to your normal activities? Yes.    Do you have any questions about your discharge instructions: Diet   No. Medications  No. Follow up visit  No.  Do you have questions or concerns about your Care? No.  Actions: * If pain score is 4 or above: No action needed, pain <4. Have you developed a fever since your procedure? no  2.   Have you had an respiratory symptoms (SOB or cough) since your procedure? no  3.   Have you tested positive for COVID 19 since your procedure no  4.   Have you had any family members/close contacts diagnosed with the COVID 19 since your procedure?  no   If yes to any of these questions please route to Laverna Peace, RN and Karlton Lemon, RN

## 2021-04-30 NOTE — Telephone Encounter (Signed)
NO ANSWER, MESSAGE LEFT FOR PATIENT. 

## 2021-05-07 ENCOUNTER — Encounter: Payer: Self-pay | Admitting: Gastroenterology

## 2021-05-21 ENCOUNTER — Telehealth: Payer: Self-pay

## 2021-05-21 ENCOUNTER — Other Ambulatory Visit: Payer: Self-pay | Admitting: Gastroenterology

## 2021-05-21 NOTE — Telephone Encounter (Signed)
Called patient to see how lactulose is working Left VM for patient to call me back.

## 2021-05-22 DIAGNOSIS — K7460 Unspecified cirrhosis of liver: Secondary | ICD-10-CM | POA: Diagnosis not present

## 2021-05-22 DIAGNOSIS — E119 Type 2 diabetes mellitus without complications: Secondary | ICD-10-CM | POA: Diagnosis not present

## 2021-05-22 DIAGNOSIS — R945 Abnormal results of liver function studies: Secondary | ICD-10-CM | POA: Diagnosis not present

## 2021-05-22 DIAGNOSIS — D649 Anemia, unspecified: Secondary | ICD-10-CM | POA: Diagnosis not present

## 2021-11-10 ENCOUNTER — Encounter (HOSPITAL_COMMUNITY): Payer: Self-pay | Admitting: Emergency Medicine

## 2021-11-10 ENCOUNTER — Other Ambulatory Visit: Payer: Self-pay

## 2021-11-10 ENCOUNTER — Inpatient Hospital Stay (HOSPITAL_COMMUNITY)
Admission: EM | Admit: 2021-11-10 | Discharge: 2021-11-14 | DRG: 432 | Disposition: A | Discharge status: Home / Self Care | Payer: PPO | Attending: Internal Medicine | Admitting: Internal Medicine

## 2021-11-10 ENCOUNTER — Inpatient Hospital Stay (HOSPITAL_COMMUNITY): Payer: PPO

## 2021-11-10 DIAGNOSIS — Z801 Family history of malignant neoplasm of trachea, bronchus and lung: Secondary | ICD-10-CM | POA: Diagnosis not present

## 2021-11-10 DIAGNOSIS — K745 Biliary cirrhosis, unspecified: Secondary | ICD-10-CM | POA: Diagnosis not present

## 2021-11-10 DIAGNOSIS — Z8249 Family history of ischemic heart disease and other diseases of the circulatory system: Secondary | ICD-10-CM

## 2021-11-10 DIAGNOSIS — K743 Primary biliary cirrhosis: Principal | ICD-10-CM | POA: Diagnosis present

## 2021-11-10 DIAGNOSIS — F17210 Nicotine dependence, cigarettes, uncomplicated: Secondary | ICD-10-CM | POA: Diagnosis present

## 2021-11-10 DIAGNOSIS — Z716 Tobacco abuse counseling: Secondary | ICD-10-CM

## 2021-11-10 DIAGNOSIS — D72829 Elevated white blood cell count, unspecified: Secondary | ICD-10-CM | POA: Diagnosis present

## 2021-11-10 DIAGNOSIS — I8511 Secondary esophageal varices with bleeding: Secondary | ICD-10-CM | POA: Diagnosis not present

## 2021-11-10 DIAGNOSIS — I8500 Esophageal varices without bleeding: Secondary | ICD-10-CM | POA: Diagnosis not present

## 2021-11-10 DIAGNOSIS — K922 Gastrointestinal hemorrhage, unspecified: Secondary | ICD-10-CM

## 2021-11-10 DIAGNOSIS — I8510 Secondary esophageal varices without bleeding: Secondary | ICD-10-CM | POA: Diagnosis not present

## 2021-11-10 DIAGNOSIS — F419 Anxiety disorder, unspecified: Secondary | ICD-10-CM | POA: Diagnosis present

## 2021-11-10 DIAGNOSIS — E785 Hyperlipidemia, unspecified: Secondary | ICD-10-CM | POA: Diagnosis not present

## 2021-11-10 DIAGNOSIS — Z803 Family history of malignant neoplasm of breast: Secondary | ICD-10-CM | POA: Diagnosis not present

## 2021-11-10 DIAGNOSIS — D6959 Other secondary thrombocytopenia: Secondary | ICD-10-CM | POA: Diagnosis present

## 2021-11-10 DIAGNOSIS — E119 Type 2 diabetes mellitus without complications: Secondary | ICD-10-CM

## 2021-11-10 DIAGNOSIS — R932 Abnormal findings on diagnostic imaging of liver and biliary tract: Secondary | ICD-10-CM | POA: Diagnosis not present

## 2021-11-10 DIAGNOSIS — K7460 Unspecified cirrhosis of liver: Secondary | ICD-10-CM | POA: Diagnosis not present

## 2021-11-10 DIAGNOSIS — Z823 Family history of stroke: Secondary | ICD-10-CM | POA: Diagnosis not present

## 2021-11-10 DIAGNOSIS — F1290 Cannabis use, unspecified, uncomplicated: Secondary | ICD-10-CM | POA: Diagnosis present

## 2021-11-10 DIAGNOSIS — K3189 Other diseases of stomach and duodenum: Secondary | ICD-10-CM | POA: Diagnosis not present

## 2021-11-10 DIAGNOSIS — R7989 Other specified abnormal findings of blood chemistry: Secondary | ICD-10-CM | POA: Diagnosis not present

## 2021-11-10 DIAGNOSIS — D62 Acute posthemorrhagic anemia: Secondary | ICD-10-CM | POA: Diagnosis not present

## 2021-11-10 DIAGNOSIS — K8301 Primary sclerosing cholangitis: Secondary | ICD-10-CM | POA: Diagnosis present

## 2021-11-10 DIAGNOSIS — F1910 Other psychoactive substance abuse, uncomplicated: Secondary | ICD-10-CM | POA: Diagnosis present

## 2021-11-10 DIAGNOSIS — K429 Umbilical hernia without obstruction or gangrene: Secondary | ICD-10-CM | POA: Diagnosis present

## 2021-11-10 DIAGNOSIS — E1165 Type 2 diabetes mellitus with hyperglycemia: Secondary | ICD-10-CM | POA: Diagnosis not present

## 2021-11-10 DIAGNOSIS — I252 Old myocardial infarction: Secondary | ICD-10-CM | POA: Diagnosis not present

## 2021-11-10 DIAGNOSIS — Z79890 Hormone replacement therapy (postmenopausal): Secondary | ICD-10-CM

## 2021-11-10 DIAGNOSIS — F32A Depression, unspecified: Secondary | ICD-10-CM | POA: Diagnosis not present

## 2021-11-10 DIAGNOSIS — Z91148 Patient's other noncompliance with medication regimen for other reason: Secondary | ICD-10-CM

## 2021-11-10 DIAGNOSIS — Z820 Family history of epilepsy and other diseases of the nervous system: Secondary | ICD-10-CM

## 2021-11-10 DIAGNOSIS — K750 Abscess of liver: Secondary | ICD-10-CM | POA: Diagnosis present

## 2021-11-10 DIAGNOSIS — Z7984 Long term (current) use of oral hypoglycemic drugs: Secondary | ICD-10-CM

## 2021-11-10 DIAGNOSIS — I2510 Atherosclerotic heart disease of native coronary artery without angina pectoris: Secondary | ICD-10-CM | POA: Diagnosis present

## 2021-11-10 DIAGNOSIS — Z7151 Drug abuse counseling and surveillance of drug abuser: Secondary | ICD-10-CM | POA: Diagnosis not present

## 2021-11-10 DIAGNOSIS — K766 Portal hypertension: Secondary | ICD-10-CM | POA: Diagnosis not present

## 2021-11-10 DIAGNOSIS — Z79899 Other long term (current) drug therapy: Secondary | ICD-10-CM

## 2021-11-10 HISTORY — DX: Esophageal varices without bleeding: I85.00

## 2021-11-10 LAB — URINALYSIS, ROUTINE W REFLEX MICROSCOPIC
Bilirubin Urine: NEGATIVE
Glucose, UA: NEGATIVE mg/dL
Hgb urine dipstick: NEGATIVE
Ketones, ur: NEGATIVE mg/dL
Leukocytes,Ua: NEGATIVE
Nitrite: NEGATIVE
Protein, ur: NEGATIVE mg/dL
Specific Gravity, Urine: 1.023 (ref 1.005–1.030)
pH: 5 (ref 5.0–8.0)

## 2021-11-10 LAB — CBC WITH DIFFERENTIAL/PLATELET
Abs Immature Granulocytes: 0.14 10*3/uL — ABNORMAL HIGH (ref 0.00–0.07)
Basophils Absolute: 0.1 10*3/uL (ref 0.0–0.1)
Basophils Relative: 1 %
Eosinophils Absolute: 0.2 10*3/uL (ref 0.0–0.5)
Eosinophils Relative: 1 %
HCT: 30.6 % — ABNORMAL LOW (ref 39.0–52.0)
Hemoglobin: 9.9 g/dL — ABNORMAL LOW (ref 13.0–17.0)
Immature Granulocytes: 1 %
Lymphocytes Relative: 3 %
Lymphs Abs: 0.5 10*3/uL — ABNORMAL LOW (ref 0.7–4.0)
MCH: 27.4 pg (ref 26.0–34.0)
MCHC: 32.4 g/dL (ref 30.0–36.0)
MCV: 84.8 fL (ref 80.0–100.0)
Monocytes Absolute: 1.2 10*3/uL — ABNORMAL HIGH (ref 0.1–1.0)
Monocytes Relative: 9 %
Neutro Abs: 12.2 10*3/uL — ABNORMAL HIGH (ref 1.7–7.7)
Neutrophils Relative %: 85 %
Platelets: 150 10*3/uL (ref 150–400)
RBC: 3.61 MIL/uL — ABNORMAL LOW (ref 4.22–5.81)
RDW: 16.4 % — ABNORMAL HIGH (ref 11.5–15.5)
WBC: 14.2 10*3/uL — ABNORMAL HIGH (ref 4.0–10.5)
nRBC: 0 % (ref 0.0–0.2)

## 2021-11-10 LAB — PREPARE RBC (CROSSMATCH)

## 2021-11-10 LAB — RAPID URINE DRUG SCREEN, HOSP PERFORMED
Amphetamines: NOT DETECTED
Barbiturates: NOT DETECTED
Benzodiazepines: NOT DETECTED
Cocaine: NOT DETECTED
Opiates: NOT DETECTED
Tetrahydrocannabinol: POSITIVE — AB

## 2021-11-10 LAB — COMPREHENSIVE METABOLIC PANEL
ALT: 514 U/L — ABNORMAL HIGH (ref 0–44)
AST: 508 U/L — ABNORMAL HIGH (ref 15–41)
Albumin: 3.4 g/dL — ABNORMAL LOW (ref 3.5–5.0)
Alkaline Phosphatase: 261 U/L — ABNORMAL HIGH (ref 38–126)
Anion gap: 8 (ref 5–15)
BUN: 39 mg/dL — ABNORMAL HIGH (ref 8–23)
CO2: 22 mmol/L (ref 22–32)
Calcium: 9.5 mg/dL (ref 8.9–10.3)
Chloride: 105 mmol/L (ref 98–111)
Creatinine, Ser: 0.89 mg/dL (ref 0.61–1.24)
GFR, Estimated: >60 mL/min (ref >60)
Glucose, Bld: 241 mg/dL — ABNORMAL HIGH (ref 70–99)
Potassium: 4.8 mmol/L (ref 3.5–5.1)
Sodium: 135 mmol/L (ref 135–145)
Total Bilirubin: 1.3 mg/dL — ABNORMAL HIGH (ref 0.3–1.2)
Total Protein: 6 g/dL — ABNORMAL LOW (ref 6.5–8.1)

## 2021-11-10 LAB — HEPATITIS PANEL, ACUTE
HCV Ab: NONREACTIVE
Hep A IgM: NONREACTIVE
Hep B C IgM: NONREACTIVE
Hepatitis B Surface Ag: NONREACTIVE

## 2021-11-10 LAB — CBC
HCT: 29.2 % — ABNORMAL LOW (ref 39.0–52.0)
HCT: 25.9 % — ABNORMAL LOW (ref 39.0–52.0)
Hemoglobin: 9.8 g/dL — ABNORMAL LOW (ref 13.0–17.0)
Hemoglobin: 8.4 g/dL — ABNORMAL LOW (ref 13.0–17.0)
MCH: 28.6 pg (ref 26.0–34.0)
MCH: 28.2 pg (ref 26.0–34.0)
MCHC: 33.6 g/dL (ref 30.0–36.0)
MCHC: 32.4 g/dL (ref 30.0–36.0)
MCV: 85.1 fL (ref 80.0–100.0)
MCV: 86.9 fL (ref 80.0–100.0)
Platelets: 75 10*3/uL — ABNORMAL LOW (ref 150–400)
Platelets: 55 10*3/uL — ABNORMAL LOW (ref 150–400)
RBC: 3.43 MIL/uL — ABNORMAL LOW (ref 4.22–5.81)
RBC: 2.98 MIL/uL — ABNORMAL LOW (ref 4.22–5.81)
RDW: 15.8 % — ABNORMAL HIGH (ref 11.5–15.5)
RDW: 15.9 % — ABNORMAL HIGH (ref 11.5–15.5)
WBC: 8.6 10*3/uL (ref 4.0–10.5)
WBC: 7.3 10*3/uL (ref 4.0–10.5)
nRBC: 0 % (ref 0.0–0.2)
nRBC: 0 % (ref 0.0–0.2)

## 2021-11-10 LAB — AMMONIA: Ammonia: 44 umol/L — ABNORMAL HIGH (ref 9–35)

## 2021-11-10 LAB — CBG MONITORING, ED
Glucose-Capillary: 207 mg/dL — ABNORMAL HIGH (ref 70–99)
Glucose-Capillary: 230 mg/dL — ABNORMAL HIGH (ref 70–99)
Glucose-Capillary: 168 mg/dL — ABNORMAL HIGH (ref 70–99)
Glucose-Capillary: 163 mg/dL — ABNORMAL HIGH (ref 70–99)
Glucose-Capillary: 242 mg/dL — ABNORMAL HIGH (ref 70–99)

## 2021-11-10 LAB — ABO/RH: ABO/RH(D): A POS

## 2021-11-10 LAB — HEMOGLOBIN A1C
Hgb A1c MFr Bld: 5.6 % (ref 4.8–5.6)
Mean Plasma Glucose: 114.02 mg/dL

## 2021-11-10 LAB — PROTIME-INR
INR: 1.1 (ref 0.8–1.2)
Prothrombin Time: 14.2 seconds (ref 11.4–15.2)

## 2021-11-10 LAB — LIPASE, BLOOD: Lipase: 42 U/L (ref 11–51)

## 2021-11-10 MED ORDER — SODIUM CHLORIDE 0.9 % IV SOLN
INTRAVENOUS | Status: DC
Start: 2021-11-10 — End: 2021-11-11

## 2021-11-10 MED ORDER — LACTULOSE 10 GM/15ML PO SOLN
10.0000 g | Freq: Every day | ORAL | Status: DC
  Administered 2021-11-10 – 2021-11-14 (×2): 10 g via ORAL
  Filled 2021-11-10: qty 30
  Filled 2021-11-10 (×2): qty 15

## 2021-11-10 MED ORDER — INSULIN ASPART 100 UNIT/ML IJ SOLN: 0.0000 [IU] | Freq: Three times a day (TID) | INTRAMUSCULAR | Status: DC

## 2021-11-10 MED ORDER — PROPRANOLOL HCL 10 MG PO TABS
10.0000 mg | ORAL_TABLET | Freq: Three times a day (TID) | ORAL | Status: DC
  Administered 2021-11-10 – 2021-11-14 (×10): 10 mg via ORAL
  Filled 2021-11-10 (×14): qty 1

## 2021-11-10 MED ORDER — ADULT MULTIVITAMIN W/MINERALS CH
1.0000 | ORAL_TABLET | Freq: Every day | ORAL | Status: DC
  Administered 2021-11-10 – 2021-11-14 (×2): 1 via ORAL
  Filled 2021-11-10 (×2): qty 1

## 2021-11-10 MED ORDER — LACTATED RINGERS IV BOLUS
1000.0000 mL | Freq: Once | INTRAVENOUS | Status: AC
Start: 2021-11-10 — End: 2021-11-10
  Administered 2021-11-10: 1000 mL via INTRAVENOUS

## 2021-11-10 MED ORDER — ONDANSETRON HCL 4 MG/2ML IJ SOLN: 4.0000 mg | Freq: Four times a day (QID) | INTRAMUSCULAR | Status: DC | PRN

## 2021-11-10 MED ORDER — OCTREOTIDE LOAD VIA INFUSION
50.0000 ug | Freq: Once | INTRAVENOUS | Status: AC
Start: 2021-11-10 — End: 2021-11-10
  Administered 2021-11-10: 50 ug via INTRAVENOUS
  Filled 2021-11-10: qty 25

## 2021-11-10 MED ORDER — INSULIN ASPART 100 UNIT/ML IJ SOLN
0.0000 [IU] | Freq: Four times a day (QID) | INTRAMUSCULAR | Status: DC
  Administered 2021-11-10: 2 [IU] via SUBCUTANEOUS
  Administered 2021-11-10 (×2): 3 [IU] via SUBCUTANEOUS
  Administered 2021-11-11: 2 [IU] via SUBCUTANEOUS

## 2021-11-10 MED ORDER — GADOBUTROL 1 MMOL/ML IV SOLN
6.0000 mL | Freq: Once | INTRAVENOUS | Status: AC | PRN
  Administered 2021-11-10: 6 mL via INTRAVENOUS

## 2021-11-10 MED ORDER — ONDANSETRON HCL 4 MG PO TABS: 4.0000 mg | ORAL_TABLET | Freq: Four times a day (QID) | ORAL | Status: DC | PRN

## 2021-11-10 MED ORDER — PANTOPRAZOLE SODIUM 40 MG IV SOLR: 40.0000 mg | Freq: Two times a day (BID) | INTRAVENOUS | Status: DC

## 2021-11-10 MED ORDER — PIOGLITAZONE HCL 15 MG PO TABS
45.0000 mg | ORAL_TABLET | Freq: Every day | ORAL | Status: DC
  Filled 2021-11-10: qty 1

## 2021-11-10 MED ORDER — ONDANSETRON HCL 4 MG/2ML IJ SOLN
4.0000 mg | Freq: Once | INTRAMUSCULAR | Status: AC
  Administered 2021-11-10: 4 mg via INTRAVENOUS
  Filled 2021-11-10: qty 2

## 2021-11-10 MED ORDER — PANTOPRAZOLE INFUSION (NEW) - SIMPLE MED
8.0000 mg/h | INTRAVENOUS | Status: AC
  Administered 2021-11-10 – 2021-11-12 (×5): 8 mg/h via INTRAVENOUS
  Filled 2021-11-10: qty 80
  Filled 2021-11-10 (×5): qty 100
  Filled 2021-11-10 (×3): qty 80
  Filled 2021-11-10 (×2): qty 100

## 2021-11-10 MED ORDER — HYDROXYZINE HCL 25 MG PO TABS: 25.0000 mg | ORAL_TABLET | Freq: Three times a day (TID) | ORAL | Status: DC | PRN

## 2021-11-10 MED ORDER — SODIUM CHLORIDE 0.9 % IV SOLN
10.0000 mL/h | Freq: Once | INTRAVENOUS | Status: DC
Start: 2021-11-10 — End: 2021-11-11

## 2021-11-10 MED ORDER — ACETAMINOPHEN 325 MG PO TABS: 650.0000 mg | ORAL_TABLET | Freq: Four times a day (QID) | ORAL | Status: DC | PRN

## 2021-11-10 MED ORDER — MELATONIN 3 MG PO TABS
3.0000 mg | ORAL_TABLET | Freq: Every day | ORAL | Status: DC
  Administered 2021-11-11 – 2021-11-13 (×3): 3 mg via ORAL
  Filled 2021-11-10 (×4): qty 1

## 2021-11-10 MED ORDER — TRAZODONE HCL 50 MG PO TABS
25.0000 mg | ORAL_TABLET | Freq: Every evening | ORAL | Status: DC | PRN
  Administered 2021-11-12 – 2021-11-13 (×2): 25 mg via ORAL
  Filled 2021-11-10 (×2): qty 1

## 2021-11-10 MED ORDER — ALPHA-LIPOIC ACID 200 MG PO CAPS: 1.0000 | ORAL_CAPSULE | Freq: Every day | ORAL | Status: DC

## 2021-11-10 MED ORDER — SODIUM CHLORIDE 0.9 % IV SOLN
2.0000 g | INTRAVENOUS | Status: DC
  Administered 2021-11-10 – 2021-11-14 (×4): 2 g via INTRAVENOUS
  Filled 2021-11-10 (×4): qty 20

## 2021-11-10 MED ORDER — ACETAMINOPHEN 650 MG RE SUPP: 650.0000 mg | Freq: Four times a day (QID) | RECTAL | Status: DC | PRN

## 2021-11-10 MED ORDER — SODIUM CHLORIDE 0.9 % IV SOLN: INTRAVENOUS | Status: DC

## 2021-11-10 MED ORDER — SODIUM CHLORIDE 0.9 % IV SOLN
10.0000 mL/h | Freq: Once | INTRAVENOUS | Status: DC
Start: 2021-11-10 — End: 2021-11-10

## 2021-11-10 MED ORDER — SODIUM CHLORIDE 0.9 % IV SOLN
50.0000 ug/h | INTRAVENOUS | Status: DC
Start: 2021-11-10 — End: 2021-11-13
  Administered 2021-11-10 – 2021-11-13 (×6): 50 ug/h via INTRAVENOUS
  Filled 2021-11-10 (×9): qty 1

## 2021-11-10 MED ORDER — PANTOPRAZOLE 80MG IVPB - SIMPLE MED
80.0000 mg | Freq: Once | INTRAVENOUS | Status: AC
Start: 2021-11-10 — End: 2021-11-10
  Administered 2021-11-10: 80 mg via INTRAVENOUS
  Filled 2021-11-10: qty 100

## 2021-11-10 NOTE — ED Provider Notes (Signed)
?[facility] EMERGENCY DEPARTMENT ?Provider Note ? ? ?CSN: [id number] ?Arrival date & time: 11/10/21  0202 ? ?  ? ?History ? ?Chief Complaint  ?Patient presents with  ? Hematemesis  ? ? ?[name] Ford is a 61 y.o. male. ? ?The history is provided by the patient.  ?He has history diabetes, hyperlipidemia, cirrhosis of the liver with esophageal varices and comes in because of vomiting blood.  He states that he was washing dishes in the evening of 4/8 when he felt dizzy and felt like his heart was racing.  He had a bowel movement which was dark black.  He subsequently had 2 more bowel movements which were dark black.  During the day today, he has had nausea and started vomiting tonight.  He vomited several times which were large amount of bright red blood.  He is feeling generally weak but denies any abdominal pain.  He has no prior history of GI bleeding. ?  ?Home Medications ?Prior to Admission medications   ?Medication Sig Start Date End Date Taking? Authorizing Provider  ?Alpha-Lipoic Acid 200 MG CAPS Take 1 capsule by mouth daily.    [provider]  ?Coenzyme Q10 (CO Q 10) 100 MG CAPS Take 1 capsule by mouth daily.    [provider]  ?Continuous Blood Gluc Receiver (FREESTYLE LIBRE 2 READER) DEVI USE DEVICE TO TEST 4 TIMES DAILY AND AS NEEDED 11/12/20   [provider]  ?Continuous Blood Gluc Sensor (FREESTYLE LIBRE 2 SENSOR) MISC SMARTSIG:Via Meter 4 Times Daily PRN 03/06/21   [provider]  ?ENULOSE 10 GM/15ML SOLN TAKE 10 MLS BY MOUTH DAILY 05/21/21   Jenel Lucks, MD  ?FREESTYLE PRECISION NEO TEST test strip  12/31/20   [provider]  ?glimepiride (AMARYL) 4 MG tablet Take 4 mg by mouth 2 (two) times daily. 02/18/21   [provider]  ?hydrOXYzine (VISTARIL) 25 MG capsule Take 25 mg by mouth as needed.    [provider]  ?Melatonin 1 MG CAPS Take 1 capsule by mouth at bedtime.    [provider]  ?metFORMIN  (GLUCOPHAGE) 1000 MG tablet Take 1,000 mg by mouth 2 (two) times daily with a meal.    [provider]  ?MILK THISTLE PO Take 1 capsule by mouth daily.    [provider]  ?Multiple Vitamin (MULTIVITAMIN) tablet Take 1 tablet by mouth daily.    [provider]  ?pioglitazone (ACTOS) 45 MG tablet Take 45 mg by mouth daily. 02/18/21   [provider]  ?propranolol (INDERAL) 10 MG tablet Propranolol 10 mg po tid with dosage titrated by heart rate (resting heart rate 55-65). 04/28/21   Jenel Lucks, MD  ?   ? ?Allergies    ?Patient has no allergy information on record.   ? ?Review of Systems   ?Review of Systems  ?All other systems reviewed and are negative. ? ?Physical Exam ?Updated Vital Signs ?BP 120/62   Pulse 80   Temp 98.2 ?F (36.8 ?C)   Resp 18   Ht 5\' 10"  (1.778 m)   Wt 63.5 kg   SpO2 99%   BMI 20.09 kg/m?  ?Physical Exam ?Vitals and nursing note reviewed.  ?61 year old male, resting comfortably and in no acute distress. Vital signs are normal. Oxygen saturation is 99%, which is normal. ?Head is normocephalic and atraumatic. PERRLA, EOMI. Oropharynx is clear.  Conjunctivae are pink. ?Neck is nontender and supple without adenopathy or JVD. ?Back is  nontender and there is no CVA tenderness. ?Lungs are clear without rales, wheezes, or rhonchi. ?Chest is nontender. ?Heart has regular rate and rhythm without murmur. ?Abdomen is soft, flat, with minimal epigastric tenderness.  There is no rebound or guarding.  Peristalsis is hypoactive.  There is no fluid wave detected. ?Extremities have no cyanosis or edema, full range of motion is present. ?Skin is warm and dry without rash. ?Neurologic: Mental status is normal, cranial nerves are intact, moves all extremities equally. ? ?ED Results / Procedures / Treatments   ?Labs ?(all labs ordered are listed, but only abnormal results are displayed) ?Labs Reviewed  ?CBG MONITORING, ED - Abnormal; Notable for the following  components:  ?    Result Value  ? Glucose-Capillary 207 (*)   ? All other components within normal limits  ?LIPASE, BLOOD  ?COMPREHENSIVE METABOLIC PANEL  ?URINALYSIS, ROUTINE W REFLEX MICROSCOPIC  ?COMPREHENSIVE METABOLIC PANEL  ?CBC WITH DIFFERENTIAL/PLATELET  ?PROTIME-INR  ?TYPE AND SCREEN  ?PREPARE RBC (CROSSMATCH)  ? ? ?EKG ?EKG Interpretation ? ?Date/Time:  Monday November 10 2021 02:00:07 EDT ?Ventricular Rate:  81 ?PR Interval:  124 ?QRS Duration: 82 ?QT Interval:  352 ?QTC Calculation: 408 ?R Axis:   3 ?Text Interpretation: Normal sinus rhythm Possible Inferior infarct , age undetermined Cannot rule out Anterior infarct , age undetermined Abnormal ECG When compared with ECG of 01-Sep-2011 12:30, No significant change was found Confirmed by Dione BoozeGlick, Rielynn Trulson ([id number]) on 11/10/2021 2:23:42 AM ? ?Radiology ?No results found. ? ?Procedures ?Procedures  ?Cardiac monitor shows normal sinus rhythm, per my interpretation. ? ?Medications Ordered in ED ?Medications - No data to display ? ?ED Course/ Medical Decision Making/ A&P ?  ?                        ?Medical Decision Making ?Amount and/or Complexity of Data Reviewed ?Labs: ordered. ? ?Risk ?Prescription drug management. ?Decision regarding hospitalization. ? ? ?Upper gastrointestinal bleeding in the setting of patient with known esophageal varices.  Currently, vital signs are normal but it is noted that he is on a beta-blocker which might inhibit tachycardia in response to bleeding.  Old records are reviewed confirming upper endoscopy on 04/28/2021 at which time grade 2 esophageal varices were present.  I have to assume that current bleeding is from his esophageal varices although other sources are possible such as peptic ulcer disease, gastritis.  Also of concern would be potential coagulopathy and patient with history of cirrhosis.  Labs are obtained including blood for type and crossmatch and INR.  He is given ondansetron for nausea and is started on pantoprazole load  and infusion and octreotide load and infusion. ? ?Patient did vomit some blood in the emergency department, no additional episodes of emesis.  Labs do show an elevated BUN consistent with upper GI bleed.  Hemoglobin has dropped 2.6 g compared with 03/19/2021.  Given his additional emesis in the ED, blood is ordered for transfusion.  Metabolic panel shows significant worsening of AST and ALT compared with 03/19/2021, mild elevation of bilirubin which is also new.  Alkaline phosphatase is elevated but not as high as it was previously.  INR is normal.  These tests are not concerning for worsening of his underlying cirrhosis.  Case is discussed with Dr. Arville CareMansy of [facility], who agrees to admit the patient.  Case also discussed with Dr. Loreta AveMann of gastroenterology service who agrees to see the patient in consultation and is available to come in on  an emergent basis if bleeding seems to be getting worse. ? ?CRITICAL CARE ?Performed by: Dione Booze ?Total critical care time: 85 minutes ?Critical care time was exclusive of separately billable procedures and treating other patients. ?Critical care was necessary to treat or prevent imminent or life-threatening deterioration. ?Critical care was time spent personally by me on the following activities: development of treatment plan with patient and/or surrogate as well as nursing, discussions with consultants, evaluation of patient's response to treatment, examination of patient, obtaining history from patient or surrogate, ordering and performing treatments and interventions, ordering and review of laboratory studies, ordering and review of radiographic studies, pulse oximetry and re-evaluation of patient's condition. ? ?Final Clinical Impression(s) / ED Diagnoses ?Final diagnoses:  ?Upper gastrointestinal bleeding  ?Esophageal varices determined by endoscopy (HCC)  ?Anemia associated with acute blood loss  ?Elevated liver function tests  ? ? ?Rx / DC Orders ?ED Discharge  Orders   ? ? None  ? ?  ? ? ?  ?Dione Booze, MD ?11/10/21 [phone] ? ?

## 2021-11-10 NOTE — Progress Notes (Signed)
?                                  PROGRESS NOTE                                             ?                                                                                                                     ?                                         ? ? Patient Demographics:  ? ? [name] Ford, is a 61 y.o. male, DOB - 04/26/1961, XNA:[id number] ? ?Outpatient Primary MD for the patient is Kaleen Mask, MD    LOS - 0  Admit date - 11/10/2021   ? ?Chief Complaint  ?Patient presents with  ? Hematemesis  ?    ? ?Brief Narrative (HPI from H&P)    61 y.o. Caucasian male with medical history significant for coronary artery disease, liver cirrhosis, depression, type diabetes mellitus, esophageal grade 2 varices, dyslipidemia, and polysubstance abuse including cocaine, tobacco and marijuana, who presented to the ER with acute onset of bloody vomitus.  Work-up in the ER suggestive of upper GI bleed was admitted to the hospital. ? ? Subjective:  ? ? [name] Ford today has, No headache, No chest pain, No abdominal pain - No Nausea, No new weakness tingling or numbness, no SOB. ? ? Assessment  & Plan :  ? ? ?Acute upper GI bleeding in a patient with known history of cirrhosis with varices in the past who is noncompliant with his propanolol.  At this time he has received 2 units of packed RBC transfusion at the time of admission due to ongoing acute GI bleed, he is on IV PPI and octreotide drip along with Rocephin for SBP prophylaxis.  GI is on board.  Continue propranolol.  Monitor H&H.  EGD likely to be done soon. ? ?History of cirrhosis with transaminitis.  GI on board doing work-up for acute hepatitis or liver injury, right upper quadrant ultrasound pending.  Management per GI. ? ?Polysubstance abuse (HCC)  - He is trying to quit smoking cigarettes and uses lozenges and gums.  He says he has not done any alcohol or cocaine in several years, counseled to quit smoking and marijuana use.   UDS pending. ?  ?DM type II.  Hold oral hypoglycemics.  Sliding scale. ? ?Lab Results  ?Component Value Date  ? HGBA1C 6.3 (H) 09/01/2011  ? ?CBG (last 3)  ?Recent Labs  ?  11/10/21 ?0223  ?GLUCAP 207*  ? ? ?   ? ?Condition -  Extremely Guarded ? ?Family Communication  :  none present ? ?Code Status :  Ful ? ?Consults  :  GI ? ?PUD Prophylaxis : PPI ? ? Procedures  :    ? ?RUQ US ? ?EGD ? ?   ? ?Disposition Plan  :   ? ?Status is: Inpatient ? ?DVT Prophylaxis  :   ? ?SCDs Start: 11/10/21 0403 ?  ? ?Lab Results  ?Component Value Date  ? PLT 150 11/10/2021  ? ? ?Diet :  ?Diet Order   ? ?       ?  Diet NPO time specified  Diet effective now       ?  ? ?  ?  ? ?  ?  ? ?Inpatient Medications ? ?Scheduled Meds: ? insulin aspart  0-9 Units Subcutaneous Q6H  ? lactulose  10 g Oral Daily  ? melatonin  3 mg Oral QHS  ? multivitamin with minerals  1 tablet Oral Daily  ? [START ON 11/13/2021] pantoprazole  40 mg Intravenous Q12H  ? propranolol  10 mg Oral TID  ? ?Continuous Infusions: ? sodium chloride    ? sodium chloride    ? sodium chloride    ? cefTRIAXone (ROCEPHIN)  IV    ? octreotide  (SANDOSTATIN)    IV infusion 50 mcg/hr (11/10/21 0305)  ? pantoprazole 8 mg/hr (11/10/21 0330)  ? ?PRN Meds:.hydrOXYzine, ondansetron **OR** ondansetron (ZOFRAN) IV, traZODone ? ?Antibiotics  :   ? ?Anti-infectives (From admission, onward)  ? ? Start     Dose/Rate Route Frequency Ordered Stop  ? 11/10/21 0900  cefTRIAXone (ROCEPHIN) 2 g in sodium chloride 0.9 % 100 mL IVPB       ? 2 g ?200 mL/hr over 30 Minutes Intravenous Every 24 hours 11/10/21 0854    ? ?  ? ? ? Time Spent in minutes  30 ? ? ?Susa RaringPrashant Takirah Binford M.D on 11/10/2021 at 9:17 AM ? ?To page go to [email or web address]  ? ?[facility] -  Office  [phone] ? ?See all Orders from today for further details ? ? ? Objective:  ? ?Vitals:  ? 11/10/21 0717 11/10/21 0730 11/10/21 0800 11/10/21 0830  ?BP: (!) 92/56 123/72 93/61 113/64  ?Pulse: 72 76 72 76  ?Resp: 14 18 15 15   ?Temp: 98.1 ?F  (36.7 ?C)     ?TempSrc: Oral     ?SpO2: 99% 99% 97% 97%  ?Weight:      ?Height:      ? ? ?Wt Readings from Last 3 Encounters:  ?11/10/21 63.5 kg  ?04/28/21 62.1 kg  ?04/08/21 62.1 kg  ? ? ? ?Intake/Output Summary (Last 24 hours) at 11/10/2021 0917 ?Last data filed at 11/10/2021 [id number] ?Gross per 24 hour  ?Intake 2308 ml  ?Output --  ?Net 2308 ml  ? ? ? ?Physical Exam ? ?Awake Alert, No new F.N deficits, Normal affect ?Martin.AT,PERRAL ?Supple Neck, No JVD,   ?Symmetrical Chest wall movement, Good air movement bilaterally, CTAB ?RRR,No Gallops,Rubs or new Murmurs,  ?+ve B.Sounds, Abd Soft, No tenderness,   ?No Cyanosis, Clubbing or edema  ?  ?  ? ? Data Review:  ? ? ?CBC ?Recent Labs  ?Lab 11/10/21 ?0232  ?WBC 14.2*  ?HGB 9.9*  ?HCT 30.6*  ?PLT 150  ?MCV 84.8  ?MCH 27.4  ?MCHC 32.4  ?RDW 16.4*  ?LYMPHSABS 0.5*  ?MONOABS 1.2*  ?EOSABS 0.2  ?BASOSABS 0.1  ? ? ?Electrolytes ?Recent Labs  ?Lab 11/10/21 ?0232  ?NA 135  ?K  4.8  ?CL 105  ?CO2 22  ?GLUCOSE 241*  ?BUN 39*  ?CREATININE 0.89  ?CALCIUM 9.5  ?AST 508*  ?ALT 514*  ?ALKPHOS 261*  ?BILITOT 1.3*  ?ALBUMIN 3.4*  ?INR 1.1  ? ? ? ?Micro Results ?No results found for this or any previous visit (from the past 240 hour(s)). ? ?Radiology Reports ?No results found.  ? ? ?

## 2021-11-10 NOTE — ED Notes (Signed)
Pt to MRI without drips per Thedore Mins MD verbal okay to hold drips for MRCP ?

## 2021-11-10 NOTE — Assessment & Plan Note (Signed)
-   The patient be admitted to a progressive bed. ?- We will follow serial hemoglobins and hematocrits. ?- He was typed and crossmatch and will be transfused 2 units of packed red blood cells. ?- We will continue his IV Protonix drip. ?- We will continue his IV octreotide drip. ?- GI consultation will be obtained. ?- Dr. Benson Norway was notified about the patient earlier. ?

## 2021-11-10 NOTE — H&P (View-Only) (Signed)
? ?Referring Provider: Dr. Lala Lund ?Primary Care Physician:  Leonard Downing, MD ?Primary Gastroenterologist:  Dr.Taheerah Guldin Candis Schatz  ? ?Reason for Consultation: GI bleed, cirrhosis ? ?HPI: [name] Ford is a 61 y.o. male with a past medical history of anxiety, depression, nonobstructive coronary artery disease s/p MI 2013, hyperlipidemia, polysubstance abuse, cirrhosis (etiology unclear), esophageal varices, portal hypertensive gastropathy, diabetes mellitus type II and cholelithiasis.  ? ?He presented to the ED early this morning with generalized weakness, palpitations with hematemesis and black tarry stools.  He reported feeling dizzy and lightheaded while washing dishes at home on 4/8.  He passed 3 black solid stools then developed nausea with vomiting up bright red blood x 3 episodes.  He stayed in bed throughout the day on Sunday and became progressively weak so he presented to the ED.  Labs in the ED showed a WBC count of 14.2.  Hemoglobin 9.9.  Hematocrit 38.6.  Platelet 150.  INR 1.1.  BUN level of 39.  Creatinine 0.89.  Sodium 135.  Potassium 4.8.  Alk phos 261.  Albumin 3.4.  Total bili 1.3.  Alk phos 261.  AST 508.  ALT 514.  Abdominal imaging has not been done.  He was started on PPI and Octreotide infusions.  A GI consult was requested for further evaluation regarding GI bleed with history of cirrhosis with esophageal varices. ? ?No further hematemesis or melenic stools since arriving to the ED.  He remains NPO.  No chest pain, palpitations or dizziness at this time.  No shortness of breath.  He endorses having intermittent epigastric pain for the past few months.  No dysphagia or heartburn.  He typically passes a normal formed brown stool most days.  Black solid stools x 3 as noted above.  No bright red rectal bleeding.  He denies ever having a screening colonoscopy.  He denies alcohol use.  No cocaine use for many years.  He is smoking 2 to 3 cigarettes daily.  He infrequently takes  Ibuprofen for generalized aches and pains.  No fevers.  No recent weight loss.  No known family history of esophageal, gastric, colon or liver cancer. ? ?He was initially seen by Dr. Dustin Flock in our GI clinic 04/08/2021 to reestablish GI management of his cirrhosis.  At that time, his AST level was 63, ALT 67, alk phos 375 and total bili 0.6.  His prior records [facility] which possibly included a liver biopsy were requested for further review. He underwent a EGD 04/28/2021 which showed grade 2 esophageal varices and portal hypertensive gastropathy. He was started on Propanolol 10 mg 1 p.o. 3 times daily for esophageal varices management.  GI follow-up was recommended but he has not been seen in our office since completing the EGD. ? ?He reports taking Propanolol for approximately 1 week post EGD but stopped taking it because it made his heart feel funny.  However, on Saturday/03/2022 he took one dose of Propanolol due to feeling his heart racing at that time.  ? ?Hepatitis B surface antigen nonreactive, Hepatitis C antibody nonreactive and HIV -negative 03/19/2021 ? ?RUQ abdominal sonogram 04/15/2021: ?1. Cirrhosis without focal liver lesion. Please note that ?multiphasic contrast enhanced MRI and CT of the most sensitive tests ?for the screening detection of hepatocellular carcinoma in the high ?risk setting of cirrhosis.  ?2.  Cholelithiasis without evidence of acute cholecystitis. ? ?RUQ sonogram 12/22/2008:   ?1.  Distended gallbladder.  Nonspecific pericholecystic fluid.  ?2.  Scalloped contour of the  liver, consistent with cirrhosis.  ?3.  Prominent pancreatic duct ? ?EGD 04/28/2021: ?- The examined portions of the nasopharynx, oropharynx and larynx were normal. ?- Grade II esophageal varices. ?- Portal hypertensive gastropathy. Biopsied. ?- Normal examined duodenum. ?Surgical [P], gastric biopsies ?- REACTIVE GASTROPATHY. ?- WARTHIN-STARRY IS NEGATIVE FOR HELICOBACTER PYLORI. ?- NO INTESTINAL  METAPLASIA, DYSPLASIA, OR MALIGNANCY ? ?Past Medical History:  ?Diagnosis Date  ? Anxiety   ? CAD (coronary artery disease)   ? Nonobstructive by cath in [facility] reportedly in 2010 with 50% RCA and 50% ramus . Last adm 03/2009 with CP/mildly elevated troponin recommended for cath/echo but patient left AMA  ? Cirrhosis ([place])   ? History of abnormal LFTs. Hx of biopsy per patient. Pt also states negative for Hep C in the past.  ? Depression   ? Diabetes mellitus   ? Esophageal varices (HCC)   ? Gallbladder disease   ? GERD (gastroesophageal reflux disease)   ? Hyperlipidemia   ? Myocardial infarction [facility])   ? 2013  ? Polysubstance abuse ([place])   ? Hx of cocaine, tobacco, marijuana use  ? ? ?Past Surgical History:  ?Procedure Laterality Date  ? KNEE SURGERY    ? L knee ACL/meniscus repair 2002  ? ? ?Prior to Admission medications   ?Medication Sig Start Date End Date Taking? Authorizing Provider  ?Alpha-Lipoic Acid 200 MG CAPS Take 1 capsule by mouth daily.    [provider]  ?Coenzyme Q10 (CO Q 10) 100 MG CAPS Take 1 capsule by mouth daily.    [provider]  ?Continuous Blood Gluc Receiver (FREESTYLE LIBRE 2 READER) DEVI USE DEVICE TO TEST 4 TIMES DAILY AND AS NEEDED 11/12/20   [provider]  ?Continuous Blood Gluc Sensor (FREESTYLE LIBRE 2 SENSOR) MISC SMARTSIG:Via Meter 4 Times Daily PRN 03/06/21   [provider]  ?ENULOSE 10 GM/15ML SOLN TAKE 10 MLS BY MOUTH DAILY 05/21/21   Daryel November, MD  ?FREESTYLE PRECISION NEO TEST test strip  12/31/20   [provider]  ?glimepiride (AMARYL) 4 MG tablet Take 4 mg by mouth 2 (two) times daily. 02/18/21   [provider]  ?hydrOXYzine (VISTARIL) 25 MG capsule Take 25 mg by mouth as needed.    [provider]  ?Melatonin 1 MG CAPS Take 1 capsule by mouth at bedtime.    [provider]  ?metFORMIN (GLUCOPHAGE) 1000 MG tablet Take 1,000 mg by mouth 2 (two) times daily with a meal.    [provider]  ?MILK THISTLE PO Take 1 capsule by mouth daily.    [provider]  ?Multiple Vitamin (MULTIVITAMIN) tablet Take 1 tablet by mouth daily.    [provider]  ?pioglitazone (ACTOS) 45 MG tablet Take 45 mg by mouth daily. 02/18/21   [provider]  ?propranolol (INDERAL) 10 MG tablet Propranolol 10 mg po tid with dosage titrated by heart rate (resting heart rate 55-65). 04/28/21   Daryel November, MD  ? ? ?Current Facility-Administered Medications  ?Medication Dose Route Frequency Provider Last Rate Last Admin  ? 0.9 %  sodium chloride infusion  500 mL Intravenous Once Daryel November, MD      ? 0.9 %  sodium chloride infusion  10 mL/hr Intravenous Once Delora Fuel, MD      ? 0.9 %  sodium chloride infusion   Intravenous Continuous Thurnell Lose, MD      ? acetaminophen (TYLENOL) tablet 650 mg  650 mg  Oral Q6H PRN Mansy, Arvella Merles, MD      ? Or  ? acetaminophen (TYLENOL) suppository 650 mg  650 mg Rectal Q6H PRN Mansy, Jan A, MD      ? hydrOXYzine (ATARAX) tablet 25 mg  25 mg Oral TID PRN Mansy, Jan A, MD      ? lactulose (CHRONULAC) 10 GM/15ML solution 10 g  10 g Oral Daily Mansy, Jan A, MD      ? melatonin tablet 3 mg  3 mg Oral QHS Mansy, Jan A, MD      ? multivitamin with minerals tablet 1 tablet  1 tablet Oral Daily Mansy, Jan A, MD      ? octreotide (SANDOSTATIN) 500 mcg in sodium chloride 0.9 % 250 mL (2 mcg/mL) infusion  50 mcg/hr Intravenous Continuous Delora Fuel, MD 25 mL/hr at 11/10/21 0305 50 mcg/hr at 11/10/21 0305  ? ondansetron (ZOFRAN) tablet 4 mg  4 mg Oral Q6H PRN Mansy, Jan A, MD      ? Or  ? ondansetron [facility]) injection 4 mg  4 mg Intravenous Q6H PRN Mansy, Jan A, MD      ? [START ON 11/13/2021] pantoprazole (PROTONIX) injection 40 mg  40 mg Intravenous I26E Delora Fuel, MD      ? pantoprozole (PROTONIX) 80 mg /NS 100 mL infusion  8 mg/hr Intravenous Continuous Delora Fuel, MD 10 mL/hr at 11/10/21 0330 8 mg/hr at 11/10/21 0330  ? pioglitazone  (ACTOS) tablet 45 mg  45 mg Oral Daily Mansy, Jan A, MD      ? propranolol (INDERAL) tablet 10 mg  10 mg Oral TID Mansy, Jan A, MD      ? traZODone (DESYREL) tablet 25 mg  25 mg Oral QHS PRN Mansy, Arvella Merles, MD      ? ?C

## 2021-11-10 NOTE — ED Notes (Signed)
Place bedside set for patient use ?

## 2021-11-10 NOTE — Assessment & Plan Note (Addendum)
-   She has a history of acute hepatitis. ?- We will follow LFTs with hydration. ?- We will obtain acute hepatitis markers. ?- We will avoid hepatotoxins. ?- We will continue his lactulose. ?- We will continue Inderal. ?

## 2021-11-10 NOTE — ED Notes (Signed)
Assumed care of this pt at 0700. Rounded on pt. Pt denies any needs.  ?

## 2021-11-10 NOTE — Anesthesia Preprocedure Evaluation (Addendum)
Anesthesia Evaluation  ?Patient identified by MRN, date of birth, ID band ?Patient awake ? ? ? ?Reviewed: ?Allergy & Precautions, NPO status , Patient's Chart, lab work & pertinent test results ? ?Airway ?Mallampati: II ? ?TM Distance: >3 FB ?Neck ROM: Full ? ? ? Dental ? ?(+) Poor Dentition ?  ?Pulmonary ?neg pulmonary ROS, former smoker,  ?  ?Pulmonary exam normal ? ? ? ? ? ? ? Cardiovascular ?+ CAD and + Past MI (2013)  ? ?Rhythm:Regular Rate:Normal ? ? ?  ?Neuro/Psych ?Anxiety Depression negative neurological ROS ?   ? GI/Hepatic ?GERD  ,(+) Cirrhosis  ? Esophageal Varices ? substance abuse ? cocaine use and marijuana use, GIB ?  ?Endo/Other  ?diabetes, Type 2, Oral Hypoglycemic Agents ? Renal/GU ?negative Renal ROS  ?negative genitourinary ?  ?Musculoskeletal ?negative musculoskeletal ROS ?(+)  ? Abdominal ?Normal abdominal exam  (+)   ?Peds ? Hematology ?negative hematology ROS ?(+)   ?Anesthesia Other Findings ? ? Reproductive/Obstetrics ? ?  ? ? ? ? ? ? ? ? ? ? ? ? ? ?  ?  ? ? ? ? ? ? ? ?Anesthesia Physical ?Anesthesia Plan ? ?ASA: 3 ? ?Anesthesia Plan: MAC  ? ?Post-op Pain Management:   ? ?Induction: Intravenous ? ?PONV Risk Score and Plan: 1 and Propofol infusion and Treatment may vary due to age or medical condition ? ?Airway Management Planned: Simple Face Mask, Natural Airway and Nasal Cannula ? ?Additional Equipment: None ? ?Intra-op Plan:  ? ?Post-operative Plan:  ? ?Informed Consent: I have reviewed the patients History and Physical, chart, labs and discussed the procedure including the risks, benefits and alternatives for the proposed anesthesia with the patient or authorized representative who has indicated his/her understanding and acceptance.  ? ? ? ?Dental advisory given ? ?Plan Discussed with: CRNA ? ?Anesthesia Plan Comments: (Lab Results ?     Component                Value               Date                 ?     WBC                      7.3                  11/10/2021           ?     HGB                      8.4 (L)             11/10/2021           ?     HCT                      25.9 (L)            11/10/2021           ?     MCV                      86.9                11/10/2021           ?     PLT  55 (L)              11/10/2021           ?Lab Results ?     Component                Value               Date                 ?     NA                       135                 11/10/2021           ?     K                        4.8                 11/10/2021           ?     CO2                      22                  11/10/2021           ?     GLUCOSE                  241 (H)             11/10/2021           ?     BUN                      39 (H)              11/10/2021           ?     CREATININE               0.89                11/10/2021           ?     CALCIUM                  9.5                 11/10/2021           ?     GFRNONAA                 >60                 11/10/2021          )  ? ? ? ? ? ?Anesthesia Quick Evaluation ? ?

## 2021-11-10 NOTE — Assessment & Plan Note (Signed)
-   He is trying to quit smoking cigarettes and uses lozenges and gums. ?- He stated that he quit drinking alcohol years ago. ?- He uses marijuana sometimes. ?- He was counseled for cessation of tobacco and marijuana. ?

## 2021-11-10 NOTE — Assessment & Plan Note (Deleted)
The patient will be placed on supplement coverage with NovoLog. ?- While he is n.p.o. we will hold off on Amaryl. ?- We will hold off metformin and continue Actos. ?

## 2021-11-10 NOTE — Consult Note (Addendum)
? ?Referring Provider: Dr. Lala Lund ?Primary Care Physician:  Leonard Downing, MD ?Primary Gastroenterologist:  Dr.Keoki Mchargue Candis Schatz  ? ?Reason for Consultation: GI bleed, cirrhosis ? ?HPI: [name] Ford is a 61 y.o. male with a past medical history of anxiety, depression, nonobstructive coronary artery disease s/p MI 2013, hyperlipidemia, polysubstance abuse, cirrhosis (etiology unclear), esophageal varices, portal hypertensive gastropathy, diabetes mellitus type II and cholelithiasis.  ? ?He presented to the ED early this morning with generalized weakness, palpitations with hematemesis and black tarry stools.  He reported feeling dizzy and lightheaded while washing dishes at home on 4/8.  He passed 3 black solid stools then developed nausea with vomiting up bright red blood x 3 episodes.  He stayed in bed throughout the day on Sunday and became progressively weak so he presented to the ED.  Labs in the ED showed a WBC count of 14.2.  Hemoglobin 9.9.  Hematocrit 38.6.  Platelet 150.  INR 1.1.  BUN level of 39.  Creatinine 0.89.  Sodium 135.  Potassium 4.8.  Alk phos 261.  Albumin 3.4.  Total bili 1.3.  Alk phos 261.  AST 508.  ALT 514.  Abdominal imaging has not been done.  He was started on PPI and Octreotide infusions.  A GI consult was requested for further evaluation regarding GI bleed with history of cirrhosis with esophageal varices. ? ?No further hematemesis or melenic stools since arriving to the ED.  He remains NPO.  No chest pain, palpitations or dizziness at this time.  No shortness of breath.  He endorses having intermittent epigastric pain for the past few months.  No dysphagia or heartburn.  He typically passes a normal formed brown stool most days.  Black solid stools x 3 as noted above.  No bright red rectal bleeding.  He denies ever having a screening colonoscopy.  He denies alcohol use.  No cocaine use for many years.  He is smoking 2 to 3 cigarettes daily.  He infrequently takes  Ibuprofen for generalized aches and pains.  No fevers.  No recent weight loss.  No known family history of esophageal, gastric, colon or liver cancer. ? ?He was initially seen by Dr. Dustin Flock in our GI clinic 04/08/2021 to reestablish GI management of his cirrhosis.  At that time, his AST level was 63, ALT 67, alk phos 375 and total bili 0.6.  His prior records from [facility] which possibly included a liver biopsy were requested for further review. He underwent a EGD 04/28/2021 which showed grade 2 esophageal varices and portal hypertensive gastropathy. He was started on Propanolol 10 mg 1 p.o. 3 times daily for esophageal varices management.  GI follow-up was recommended but he has not been seen in our office since completing the EGD. ? ?He reports taking Propanolol for approximately 1 week post EGD but stopped taking it because it made his heart feel funny.  However, on Saturday/03/2022 he took one dose of Propanolol due to feeling his heart racing at that time.  ? ?Hepatitis B surface antigen nonreactive, Hepatitis C antibody nonreactive and HIV -negative 03/19/2021 ? ?RUQ abdominal sonogram 04/15/2021: ?1. Cirrhosis without focal liver lesion. Please note that ?multiphasic contrast enhanced MRI and CT of the most sensitive tests ?for the screening detection of hepatocellular carcinoma in the high ?risk setting of cirrhosis.  ?2.  Cholelithiasis without evidence of acute cholecystitis. ? ?RUQ sonogram 12/22/2008:   ?1.  Distended gallbladder.  Nonspecific pericholecystic fluid.  ?2.  Scalloped contour of the  liver, consistent with cirrhosis.  ?3.  Prominent pancreatic duct ? ?EGD 04/28/2021: ?- The examined portions of the nasopharynx, oropharynx and larynx were normal. ?- Grade II esophageal varices. ?- Portal hypertensive gastropathy. Biopsied. ?- Normal examined duodenum. ?Surgical [P], gastric biopsies ?- REACTIVE GASTROPATHY. ?- WARTHIN-STARRY IS NEGATIVE FOR HELICOBACTER PYLORI. ?- NO INTESTINAL  METAPLASIA, DYSPLASIA, OR MALIGNANCY ? ?Past Medical History:  ?Diagnosis Date  ? Anxiety   ? CAD (coronary artery disease)   ? Nonobstructive by cath in [facility] reportedly in 2010 with 50% RCA and 50% ramus . Last adm 03/2009 with CP/mildly elevated troponin recommended for cath/echo but patient left AMA  ? Cirrhosis ([place])   ? History of abnormal LFTs. Hx of biopsy per patient. Pt also states negative for Hep C in the past.  ? Depression   ? Diabetes mellitus   ? Esophageal varices (HCC)   ? Gallbladder disease   ? GERD (gastroesophageal reflux disease)   ? Hyperlipidemia   ? Myocardial infarction [facility])   ? 2013  ? Polysubstance abuse ([place])   ? Hx of cocaine, tobacco, marijuana use  ? ? ?Past Surgical History:  ?Procedure Laterality Date  ? KNEE SURGERY    ? L knee ACL/meniscus repair 2002  ? ? ?Prior to Admission medications   ?Medication Sig Start Date End Date Taking? Authorizing Provider  ?Alpha-Lipoic Acid 200 MG CAPS Take 1 capsule by mouth daily.    [provider]  ?Coenzyme Q10 (CO Q 10) 100 MG CAPS Take 1 capsule by mouth daily.    [provider]  ?Continuous Blood Gluc Receiver (FREESTYLE LIBRE 2 READER) DEVI USE DEVICE TO TEST 4 TIMES DAILY AND AS NEEDED 11/12/20   [provider]  ?Continuous Blood Gluc Sensor (FREESTYLE LIBRE 2 SENSOR) MISC SMARTSIG:Via Meter 4 Times Daily PRN 03/06/21   [provider]  ?ENULOSE 10 GM/15ML SOLN TAKE 10 MLS BY MOUTH DAILY 05/21/21   Daryel November, MD  ?FREESTYLE PRECISION NEO TEST test strip  12/31/20   [provider]  ?glimepiride (AMARYL) 4 MG tablet Take 4 mg by mouth 2 (two) times daily. 02/18/21   [provider]  ?hydrOXYzine (VISTARIL) 25 MG capsule Take 25 mg by mouth as needed.    [provider]  ?Melatonin 1 MG CAPS Take 1 capsule by mouth at bedtime.    [provider]  ?metFORMIN (GLUCOPHAGE) 1000 MG tablet Take 1,000 mg by mouth 2 (two) times daily with a meal.    [provider]  ?MILK THISTLE PO Take 1 capsule by mouth daily.    [provider]  ?Multiple Vitamin (MULTIVITAMIN) tablet Take 1 tablet by mouth daily.    [provider]  ?pioglitazone (ACTOS) 45 MG tablet Take 45 mg by mouth daily. 02/18/21   [provider]  ?propranolol (INDERAL) 10 MG tablet Propranolol 10 mg po tid with dosage titrated by heart rate (resting heart rate 55-65). 04/28/21   Daryel November, MD  ? ? ?Current Facility-Administered Medications  ?Medication Dose Route Frequency Provider Last Rate Last Admin  ? 0.9 %  sodium chloride infusion  500 mL Intravenous Once Daryel November, MD      ? 0.9 %  sodium chloride infusion  10 mL/hr Intravenous Once Delora Fuel, MD      ? 0.9 %  sodium chloride infusion   Intravenous Continuous Thurnell Lose, MD      ? acetaminophen (TYLENOL) tablet 650 mg  650 mg  Oral Q6H PRN Mansy, Arvella Merles, MD      ? Or  ? acetaminophen (TYLENOL) suppository 650 mg  650 mg Rectal Q6H PRN Mansy, Jan A, MD      ? hydrOXYzine (ATARAX) tablet 25 mg  25 mg Oral TID PRN Mansy, Jan A, MD      ? lactulose (CHRONULAC) 10 GM/15ML solution 10 g  10 g Oral Daily Mansy, Jan A, MD      ? melatonin tablet 3 mg  3 mg Oral QHS Mansy, Jan A, MD      ? multivitamin with minerals tablet 1 tablet  1 tablet Oral Daily Mansy, Jan A, MD      ? octreotide (SANDOSTATIN) 500 mcg in sodium chloride 0.9 % 250 mL (2 mcg/mL) infusion  50 mcg/hr Intravenous Continuous Delora Fuel, MD 25 mL/hr at 11/10/21 0305 50 mcg/hr at 11/10/21 0305  ? ondansetron (ZOFRAN) tablet 4 mg  4 mg Oral Q6H PRN Mansy, Jan A, MD      ? Or  ? ondansetron [facility]) injection 4 mg  4 mg Intravenous Q6H PRN Mansy, Jan A, MD      ? [START ON 11/13/2021] pantoprazole (PROTONIX) injection 40 mg  40 mg Intravenous [id number] Delora Fuel, MD      ? pantoprozole (PROTONIX) 80 mg /NS 100 mL infusion  8 mg/hr Intravenous Continuous Delora Fuel, MD 10 mL/hr at 11/10/21 0330 8 mg/hr at 11/10/21 0330  ? pioglitazone  (ACTOS) tablet 45 mg  45 mg Oral Daily Mansy, Jan A, MD      ? propranolol (INDERAL) tablet 10 mg  10 mg Oral TID Mansy, Jan A, MD      ? traZODone (DESYREL) tablet 25 mg  25 mg Oral QHS PRN Mansy, Arvella Merles, MD      ? ?C

## 2021-11-10 NOTE — Progress Notes (Signed)
PHARMACIST - PHYSICIAN ORDER COMMUNICATION  CONCERNING: P&T Medication Policy on Herbal Medications  DESCRIPTION:  This patient's order for:  Alpha Lipoic Acid  has been noted.  This product(s) is classified as an "herbal" or natural product. Due to a lack of definitive safety studies or FDA approval, nonstandard manufacturing practices, plus the potential risk of unknown drug-drug interactions while on inpatient medications, the Pharmacy and Therapeutics Committee does not permit the use of "herbal" or natural products of this type within [place].   ACTION TAKEN: The pharmacy department is unable to verify this order at this time and your patient has been informed of this safety policy. Please reevaluate patient's clinical condition at discharge and address if the herbal or natural product(s) should be resumed at that time.   

## 2021-11-10 NOTE — ED Triage Notes (Signed)
Pt c/o hematemesis, black tarry stools, palpitations, and weakness since 2300 last night. Pt has hx esophageal varices ?

## 2021-11-10 NOTE — H&P (Signed)
?  ?  ?[place] ? ? ?PATIENT NAME: [name] Ford   ? ?MR#:  [id number] ? ?DATE OF BIRTH:  1961/04/23 ? ?DATE OF ADMISSION:  11/10/2021 ? ?PRIMARY CARE PHYSICIAN: Leonard Downing, MD  ? ?Patient is coming from: Home ? ?REQUESTING/REFERRING PHYSICIAN: Delora Fuel, MD ? ?CHIEF COMPLAINT:  ? ?Chief Complaint  ?Patient presents with  ? Hematemesis  ? ? ?HISTORY OF PRESENT ILLNESS:  ?[name] Ford is a 61 y.o. Caucasian male with medical history significant for coronary artery disease, liver cirrhosis, depression, type diabetes mellitus, esophageal grade 2 varices, dyslipidemia, and polysubstance abuse including cocaine, tobacco and marijuana, who presented to the ER with acute onset of bloody vomitus.  The patient was washing dishes on 4/8 when he felt dizzy with palpitations.  He had a bowel movement that was dark black and then later on had to more bowel movements that were dark black.  During the day he had nausea and started vomiting tonight several times.  He admitted to generalized weakness.  He denied any abdominal pain on heartburn,, dysuria, oliguria, hematuria or flank pain.  No chest pain or dyspnea or cough or hemoptysis.  No  other bleeding diathesis. ? ?ED course: When he came to the ER vital signs were within normal and later BP was 93/57.  Labs revealed hyperglycemia of 241 with a BUN of 39, alk phos 261 AST 508 and ALT 514 with total bili of 6 and total bili of 1.3.  Previous AST was 63 and ALT 6 was 67 on 03/19/2021.  Total bili then was 0.6.  CBC tonight showed leukocytosis of 14.2 with neutrophilia, hemoglobin of 9.9 hematocrit 30.6 compared to 12.5/38.7 on 03/20/2019.  INR was 1.1 and PT 14.2. ?EKG as reviewed by me : EKG showed normal sinus rhythm with a rate of 81. ? ?The patient was typed and crossmatched and will be transfused units of packed blood cells.  Dr. Benson Norway was notified and is aware about the patient.  The patient will be admitted to progressive unit bed for further evaluation and  management. ? ?PAST MEDICAL HISTORY:  ? ?Past Medical History:  ?Diagnosis Date  ? Anxiety   ? CAD (coronary artery disease)   ? Nonobstructive by cath in [facility] reportedly in 2010 with 50% RCA and 50% ramus . Last adm 03/2009 with CP/mildly elevated troponin recommended for cath/echo but patient left AMA  ? Cirrhosis ([place])   ? History of abnormal LFTs. Hx of biopsy per patient. Pt also states negative for Hep C in the past.  ? Depression   ? Diabetes mellitus   ? Esophageal varices ([facility])   ? Gallbladder disease   ? GERD (gastroesophageal reflux disease)   ? Hyperlipidemia   ? Myocardial infarction [facility])   ? 2013  ? Polysubstance abuse ([place])   ? Hx of cocaine, tobacco, marijuana use  ? ? ?PAST SURGICAL HISTORY:  ? ?Past Surgical History:  ?Procedure Laterality Date  ? KNEE SURGERY    ? L knee ACL/meniscus repair 2002  ? ? ?SOCIAL HISTORY:  ? ?Social History  ? ?Tobacco Use  ? Smoking status: Former  ?  Packs/day: 1.00  ?  Years: 35.00  ?  Pack years: 35.00  ?  Types: Cigarettes  ? Smokeless tobacco: Never  ?Substance Use Topics  ? Alcohol use: No  ? ? ?FAMILY HISTORY:  ? ?Family History  ?Problem Relation Age of Onset  ? Breast cancer Mother   ? Lung cancer Mother   ?  smoking history  ? Stroke Father   ?     6s  ? Hypertension Father   ? Alzheimer's disease Father   ? Colon cancer Neg Hx   ? Esophageal cancer Neg Hx   ? Stomach cancer Neg Hx   ? Rectal cancer Neg Hx   ? ? ?DRUG ALLERGIES:  ?Not on File ? ?REVIEW OF SYSTEMS:  ? ?ROS ?As per history of present illness. All pertinent systems were reviewed above. Constitutional, HEENT, cardiovascular, respiratory, GI, GU, musculoskeletal, neuro, psychiatric, endocrine, integumentary and hematologic systems were reviewed and are otherwise negative/unremarkable except for positive findings mentioned above in the HPI. ? ? ?MEDICATIONS AT HOME:  ? ?Prior to Admission medications   ?Medication Sig Start Date End Date Taking? Authorizing Provider  ?Alpha-Lipoic Acid  200 MG CAPS Take 1 capsule by mouth daily.    [provider]  ?Coenzyme Q10 (CO Q 10) 100 MG CAPS Take 1 capsule by mouth daily.    [provider]  ?Continuous Blood Gluc Receiver (FREESTYLE LIBRE 2 READER) DEVI USE DEVICE TO TEST 4 TIMES DAILY AND AS NEEDED 11/12/20   [provider]  ?Continuous Blood Gluc Sensor (FREESTYLE LIBRE 2 SENSOR) MISC SMARTSIG:Via Meter 4 Times Daily PRN 03/06/21   [provider]  ?ENULOSE 10 GM/15ML SOLN TAKE 10 MLS BY MOUTH DAILY 05/21/21   Daryel November, MD  ?FREESTYLE PRECISION NEO TEST test strip  12/31/20   [provider]  ?glimepiride (AMARYL) 4 MG tablet Take 4 mg by mouth 2 (two) times daily. 02/18/21   [provider]  ?hydrOXYzine (VISTARIL) 25 MG capsule Take 25 mg by mouth as needed.    [provider]  ?Melatonin 1 MG CAPS Take 1 capsule by mouth at bedtime.    [provider]  ?metFORMIN (GLUCOPHAGE) 1000 MG tablet Take 1,000 mg by mouth 2 (two) times daily with a meal.    [provider]  ?MILK THISTLE PO Take 1 capsule by mouth daily.    [provider]  ?Multiple Vitamin (MULTIVITAMIN) tablet Take 1 tablet by mouth daily.    [provider]  ?pioglitazone (ACTOS) 45 MG tablet Take 45 mg by mouth daily. 02/18/21   [provider]  ?propranolol (INDERAL) 10 MG tablet Propranolol 10 mg po tid with dosage titrated by heart rate (resting heart rate 55-65). 04/28/21   Daryel November, MD  ? ?  ? ?VITAL SIGNS:  ?Blood pressure (!) 93/57, pulse 72, temperature 98.2 ?F (36.8 ?C), resp. rate 16, height 5' 10"  (1.778 m), weight 63.5 kg, SpO2 99 %. ? ?PHYSICAL EXAMINATION:  ?Physical Exam ? ?GENERAL:  61 y.o.-year-old Caucasian male patient lying in the bed with no acute distress.  ?EYES: Pupils equal, round, reactive to light and accommodation. No scleral icterus.  Positive pallor.  Extraocular muscles intact.  ?HEENT: Head atraumatic, normocephalic. Oropharynx and  nasopharynx clear.  ?NECK:  Supple, no jugular venous distention. No thyroid enlargement, no tenderness.  ?LUNGS: Normal breath sounds bilaterally, no wheezing, rales,rhonchi or crepitation. No use of accessory muscles of respiration.  ?CARDIOVASCULAR: Regular rate and rhythm, S1, S2 normal. No murmurs, rubs, or gallops.  ?ABDOMEN: Soft, nondistended, nontender. Bowel sounds present. No organomegaly or mass.  ?EXTREMITIES: No pedal edema, cyanosis, or clubbing.  ?NEUROLOGIC: Cranial nerves II through XII are intact. Muscle strength 5/5 in all extremities. Sensation intact. Gait not checked.  ?PSYCHIATRIC: The patient is alert and oriented x 3.  Normal affect and good eye contact. ?  SKIN: No obvious rash, lesion, or ulcer.  ? ?LABORATORY PANEL:  ? ?CBC ?Recent Labs  ?Lab 11/10/21 ?0232  ?WBC 14.2*  ?HGB 9.9*  ?HCT 30.6*  ?PLT 150  ? ?------------------------------------------------------------------------------------------------------------------ ? ?Chemistries  ?Recent Labs  ?Lab 11/10/21 ?0232  ?NA 135  ?K 4.8  ?CL 105  ?CO2 22  ?GLUCOSE 241*  ?BUN 39*  ?CREATININE 0.89  ?CALCIUM 9.5  ?AST 508*  ?ALT 514*  ?ALKPHOS 261*  ?BILITOT 1.3*  ? ?------------------------------------------------------------------------------------------------------------------ ? ?Cardiac Enzymes ?No results for input(s): TROPONINI in the last 168 hours. ?------------------------------------------------------------------------------------------------------------------ ? ?RADIOLOGY:  ?No results found. ? ? ? ?IMPRESSION AND PLAN:  ?Assessment and Plan: ?* Upper GI bleeding ?- The patient be admitted to a progressive bed. ?- We will follow serial hemoglobins and hematocrits. ?- He was typed and crossmatch and will be transfused 2 units of packed red blood cells. ?- We will continue his IV Protonix drip. ?- We will continue his IV octreotide drip. ?- GI consultation will be obtained. ?- Dr. Benson Norway was notified about the patient earlier. ? ?Type 2  diabetes mellitus without complications ([place]) ?- The patient will be placed on supplement coverage with NovoLog. ?- While he is n.p.o. we will hold off on Amaryl. ?- We will hold off metformin and continu

## 2021-11-10 NOTE — ED Notes (Signed)
This RN went into the room to check on the patient and found the patient standing beside the beside commode trying to take off underwear and a large amount of dark bloody liquid stool noted on the floor. Dr. Durwin Nora notified and to beside. Pt stated that he fell back when he was trying to sit on the commode and it slide out from behind him and he landed on his bottom. Pt stated that he did fall a second time getting up but denies any head strike both times. Pt states "all wires got me." Assessment found the pt to be neurologically intact. No noted injury. Denies any pain beside pain in abd.  ?

## 2021-11-10 NOTE — Assessment & Plan Note (Signed)
-   The patient will be placed on supplement coverage with NovoLog. ?- While he is n.p.o. we will hold off on Amaryl. ?- We will hold off metformin and continue Actos. ?

## 2021-11-10 NOTE — ED Notes (Signed)
Pt requested to go to bathroom. Bedside commode requested.  ?

## 2021-11-11 ENCOUNTER — Encounter (HOSPITAL_COMMUNITY)
Admission: EM | Disposition: A | Discharge status: Home / Self Care | Payer: Self-pay | Source: Home / Self Care | Attending: Internal Medicine

## 2021-11-11 ENCOUNTER — Inpatient Hospital Stay (HOSPITAL_COMMUNITY): Payer: PPO | Admitting: Anesthesiology

## 2021-11-11 DIAGNOSIS — I2510 Atherosclerotic heart disease of native coronary artery without angina pectoris: Secondary | ICD-10-CM

## 2021-11-11 DIAGNOSIS — K922 Gastrointestinal hemorrhage, unspecified: Secondary | ICD-10-CM | POA: Diagnosis not present

## 2021-11-11 DIAGNOSIS — K766 Portal hypertension: Secondary | ICD-10-CM

## 2021-11-11 DIAGNOSIS — I8510 Secondary esophageal varices without bleeding: Secondary | ICD-10-CM

## 2021-11-11 DIAGNOSIS — K3189 Other diseases of stomach and duodenum: Secondary | ICD-10-CM

## 2021-11-11 DIAGNOSIS — K8301 Primary sclerosing cholangitis: Secondary | ICD-10-CM

## 2021-11-11 DIAGNOSIS — I8500 Esophageal varices without bleeding: Secondary | ICD-10-CM

## 2021-11-11 DIAGNOSIS — I8511 Secondary esophageal varices with bleeding: Secondary | ICD-10-CM

## 2021-11-11 HISTORY — PX: ESOPHAGOGASTRODUODENOSCOPY (EGD) WITH PROPOFOL: SHX5813

## 2021-11-11 HISTORY — PX: ESOPHAGEAL BANDING: SHX5518

## 2021-11-11 LAB — CBC
HCT: 24.9 % — ABNORMAL LOW (ref 39.0–52.0)
HCT: 23.9 % — ABNORMAL LOW (ref 39.0–52.0)
Hemoglobin: 8.4 g/dL — ABNORMAL LOW (ref 13.0–17.0)
Hemoglobin: 7.8 g/dL — ABNORMAL LOW (ref 13.0–17.0)
MCH: 28.4 pg (ref 26.0–34.0)
MCH: 28 pg (ref 26.0–34.0)
MCHC: 33.7 g/dL (ref 30.0–36.0)
MCHC: 32.6 g/dL (ref 30.0–36.0)
MCV: 84.1 fL (ref 80.0–100.0)
MCV: 85.7 fL (ref 80.0–100.0)
Platelets: 53 10*3/uL — ABNORMAL LOW (ref 150–400)
Platelets: 53 10*3/uL — ABNORMAL LOW (ref 150–400)
RBC: 2.96 MIL/uL — ABNORMAL LOW (ref 4.22–5.81)
RBC: 2.79 MIL/uL — ABNORMAL LOW (ref 4.22–5.81)
RDW: 16.2 % — ABNORMAL HIGH (ref 11.5–15.5)
RDW: 16.3 % — ABNORMAL HIGH (ref 11.5–15.5)
WBC: 5.5 10*3/uL (ref 4.0–10.5)
WBC: 5.2 10*3/uL (ref 4.0–10.5)
nRBC: 0 % (ref 0.0–0.2)
nRBC: 0 % (ref 0.0–0.2)

## 2021-11-11 LAB — COMPREHENSIVE METABOLIC PANEL
ALT: 587 U/L — ABNORMAL HIGH (ref 0–44)
AST: 355 U/L — ABNORMAL HIGH (ref 15–41)
Albumin: 2.8 g/dL — ABNORMAL LOW (ref 3.5–5.0)
Alkaline Phosphatase: 192 U/L — ABNORMAL HIGH (ref 38–126)
Anion gap: 6 (ref 5–15)
BUN: 25 mg/dL — ABNORMAL HIGH (ref 8–23)
CO2: 25 mmol/L (ref 22–32)
Calcium: 8.4 mg/dL — ABNORMAL LOW (ref 8.9–10.3)
Chloride: 105 mmol/L (ref 98–111)
Creatinine, Ser: 0.84 mg/dL (ref 0.61–1.24)
GFR, Estimated: >60 mL/min (ref >60)
Glucose, Bld: 187 mg/dL — ABNORMAL HIGH (ref 70–99)
Potassium: 4.1 mmol/L (ref 3.5–5.1)
Sodium: 136 mmol/L (ref 135–145)
Total Bilirubin: 1 mg/dL (ref 0.3–1.2)
Total Protein: 5 g/dL — ABNORMAL LOW (ref 6.5–8.1)

## 2021-11-11 LAB — CBC WITH DIFFERENTIAL/PLATELET
Abs Immature Granulocytes: 0.04 10*3/uL (ref 0.00–0.07)
Basophils Absolute: 0 10*3/uL (ref 0.0–0.1)
Basophils Relative: 0 %
Eosinophils Absolute: 0 10*3/uL (ref 0.0–0.5)
Eosinophils Relative: 0 %
HCT: 23.4 % — ABNORMAL LOW (ref 39.0–52.0)
Hemoglobin: 8 g/dL — ABNORMAL LOW (ref 13.0–17.0)
Immature Granulocytes: 1 %
Lymphocytes Relative: 6 %
Lymphs Abs: 0.4 10*3/uL — ABNORMAL LOW (ref 0.7–4.0)
MCH: 28.8 pg (ref 26.0–34.0)
MCHC: 34.2 g/dL (ref 30.0–36.0)
MCV: 84.2 fL (ref 80.0–100.0)
Monocytes Absolute: 0.9 10*3/uL (ref 0.1–1.0)
Monocytes Relative: 16 %
Neutro Abs: 4.4 10*3/uL (ref 1.7–7.7)
Neutrophils Relative %: 77 %
Platelets: 55 10*3/uL — ABNORMAL LOW (ref 150–400)
RBC: 2.78 MIL/uL — ABNORMAL LOW (ref 4.22–5.81)
RDW: 16 % — ABNORMAL HIGH (ref 11.5–15.5)
WBC: 5.7 10*3/uL (ref 4.0–10.5)
nRBC: 0 % (ref 0.0–0.2)

## 2021-11-11 LAB — GLUCOSE, CAPILLARY
Glucose-Capillary: 184 mg/dL — ABNORMAL HIGH (ref 70–99)
Glucose-Capillary: 209 mg/dL — ABNORMAL HIGH (ref 70–99)
Glucose-Capillary: 419 mg/dL — ABNORMAL HIGH (ref 70–99)
Glucose-Capillary: 277 mg/dL — ABNORMAL HIGH (ref 70–99)

## 2021-11-11 LAB — MAGNESIUM: Magnesium: 1.6 mg/dL — ABNORMAL LOW (ref 1.7–2.4)

## 2021-11-11 LAB — GLUCOSE, RANDOM: Glucose, Bld: 393 mg/dL — ABNORMAL HIGH (ref 70–99)

## 2021-11-11 LAB — IRON AND TIBC
Iron: 14 ug/dL — ABNORMAL LOW (ref 45–182)
Saturation Ratios: 4 % — ABNORMAL LOW (ref 17.9–39.5)
TIBC: 385 ug/dL (ref 250–450)
UIBC: 371 ug/dL

## 2021-11-11 LAB — C-REACTIVE PROTEIN: CRP: 10.6 mg/dL — ABNORMAL HIGH (ref <1.0)

## 2021-11-11 LAB — FERRITIN: Ferritin: 83 ng/mL (ref 24–336)

## 2021-11-11 LAB — BRAIN NATRIURETIC PEPTIDE: B Natriuretic Peptide: 54.9 pg/mL (ref 0.0–100.0)

## 2021-11-11 LAB — PROCALCITONIN: Procalcitonin: 1.16 ng/mL

## 2021-11-11 LAB — PROTIME-INR
INR: 1.3 — ABNORMAL HIGH (ref 0.8–1.2)
Prothrombin Time: 15.8 seconds — ABNORMAL HIGH (ref 11.4–15.2)

## 2021-11-11 LAB — CBG MONITORING, ED
Glucose-Capillary: 179 mg/dL — ABNORMAL HIGH (ref 70–99)
Glucose-Capillary: 166 mg/dL — ABNORMAL HIGH (ref 70–99)

## 2021-11-11 SURGERY — ESOPHAGOGASTRODUODENOSCOPY (EGD) WITH PROPOFOL
Anesthesia: Monitor Anesthesia Care

## 2021-11-11 MED ORDER — LACTATED RINGERS IV SOLN: INTRAVENOUS | Status: DC | PRN

## 2021-11-11 MED ORDER — MAGNESIUM SULFATE 2 GM/50ML IV SOLN
2.0000 g | Freq: Once | INTRAVENOUS | Status: AC
  Administered 2021-11-11: 2 g via INTRAVENOUS
  Filled 2021-11-11: qty 50

## 2021-11-11 MED ORDER — INSULIN ASPART 100 UNIT/ML IJ SOLN
0.0000 [IU] | Freq: Three times a day (TID) | INTRAMUSCULAR | Status: DC
  Administered 2021-11-12: 3 [IU] via SUBCUTANEOUS
  Administered 2021-11-12: 8 [IU] via SUBCUTANEOUS
  Administered 2021-11-12 – 2021-11-13 (×2): 3 [IU] via SUBCUTANEOUS
  Administered 2021-11-13: 5 [IU] via SUBCUTANEOUS
  Administered 2021-11-14: 2 [IU] via SUBCUTANEOUS

## 2021-11-11 MED ORDER — SODIUM CHLORIDE 0.9 % IV SOLN: INTRAVENOUS | Status: DC

## 2021-11-11 MED ORDER — PROPOFOL 500 MG/50ML IV EMUL
INTRAVENOUS | Status: DC | PRN
  Administered 2021-11-11: 125 ug/kg/min via INTRAVENOUS

## 2021-11-11 MED ORDER — ACETAMINOPHEN 325 MG PO TABS
650.0000 mg | ORAL_TABLET | Freq: Four times a day (QID) | ORAL | Status: DC | PRN
  Administered 2021-11-11: 650 mg via ORAL
  Filled 2021-11-11: qty 2

## 2021-11-11 MED ORDER — SODIUM CHLORIDE 0.9 % IV SOLN: INTRAVENOUS | Status: AC

## 2021-11-11 MED ORDER — PROPOFOL 10 MG/ML IV BOLUS
INTRAVENOUS | Status: DC | PRN
  Administered 2021-11-11 (×2): 30 mg via INTRAVENOUS

## 2021-11-11 MED ORDER — INSULIN ASPART 100 UNIT/ML IJ SOLN
0.0000 [IU] | Freq: Every day | INTRAMUSCULAR | Status: DC
  Administered 2021-11-11: 3 [IU] via SUBCUTANEOUS
  Administered 2021-11-12: 2 [IU] via SUBCUTANEOUS
  Administered 2021-11-13: 3 [IU] via SUBCUTANEOUS

## 2021-11-11 MED ORDER — INSULIN GLARGINE-YFGN 100 UNIT/ML ~~LOC~~ SOLN
8.0000 [IU] | Freq: Once | SUBCUTANEOUS | Status: AC
Start: 2021-11-11 — End: 2021-11-11
  Administered 2021-11-11: 8 [IU] via SUBCUTANEOUS
  Filled 2021-11-11: qty 0.08

## 2021-11-11 MED ORDER — INSULIN ASPART 100 UNIT/ML IJ SOLN
15.0000 [IU] | Freq: Once | INTRAMUSCULAR | Status: AC
  Administered 2021-11-11: 15 [IU] via SUBCUTANEOUS

## 2021-11-11 SURGICAL SUPPLY — 15 items

## 2021-11-11 NOTE — Progress Notes (Signed)
?                                  PROGRESS NOTE                                             ?                                                                                                                     ?                                         ? ? Patient Demographics:  ? ? [name] Ford, is a 61 y.o. male, DOB - 07/04/1961, [id number] ? ?Outpatient Primary MD for the patient is Leonard Downing, MD    LOS - 1  Admit date - 11/10/2021   ? ?Chief Complaint  ?Patient presents with  ? Hematemesis  ?    ? ?Brief Narrative (HPI from H&P)    61 y.o. Caucasian male with medical history significant for coronary artery disease, liver cirrhosis, depression, type diabetes mellitus, esophageal grade 2 varices, dyslipidemia, and polysubstance abuse including cocaine, tobacco and marijuana, who presented to the ER with acute onset of bloody vomitus.  Work-up in the ER suggestive of upper GI bleed was admitted to the hospital. ? ? Subjective:  ? ?Patient in bed, appears comfortable, denies any headache, no fever, no chest pain or pressure, no shortness of breath , no abdominal pain had some bloody bowel movements last night. No new focal weakness. ? ? ? Assessment  & Plan :  ? ? ?Acute upper GI bleeding in a patient with known history of cirrhosis with varices in the past who is noncompliant with his propanolol.  At this time he has received 2 units of packed RBC transfusion at the time of admission due to ongoing acute GI bleed, he is on IV PPI and octreotide drip along with Rocephin for SBP prophylaxis.  Continue propanolol, GI on board and he is undergoing EGD on 11/11/2021, continue to monitor CBC and transfuse as needed goal hemoglobin 7.5.  May also require some platelet transfusion ? ?Cirrhosis related thrombocytopenia worse with blood loss.  Monitor transfuse if it drops below 20 with ongoing bleed, MRCP noted will defer to GI.  He has minimal to no right upper quadrant pain  tenderness or pain, do not think he has hepatic abscess or acute cholecystitis but will defer GI related issues to GI team. ? ?history of cirrhosis with transaminitis.  GI on board doing work-up for acute hepatitis or liver injury, Management per GI. ? ?Polysubstance abuse ([place])  - He is trying to quit smoking cigarettes and uses lozenges and  gums.  He says he has not done any alcohol or cocaine in several years, counseled to quit smoking and marijuana use.    ? ?Gallbladder wall thickening noted on MRCP and right upper quadrant ultrasound.  Pain-free will with minimal to no tenderness on deep palpation in the right upper quadrant, defer to GI. ? ?DM type II.  Hold oral hypoglycemics.  Sliding scale. ? ?Lab Results  ?Component Value Date  ? HGBA1C 5.6 11/10/2021  ? ?CBG (last 3)  ?Recent Labs  ?  11/10/21 ?2123 11/11/21 ?[id number] 11/11/21 ?[id number]  ?GLUCAP 242* 179* 166*  ? ? ?   ? ?Condition - Extremely Guarded ? ?Family Communication  :  none present ? ?Code Status :  Ful ? ?Consults  :  GI ? ?PUD Prophylaxis : PPI ? ? Procedures  :    ? ?MRCP - 1. Extremely heterogeneous, nodular cirrhotic liver with extensive dysplastic/regenerative nodularity throughout. 2. In the posterior liver dome and posterior right lobe of the liver, there are multiple closely adjacent, heterogeneously rim enhancing, internal fluid signal lesions, largest of which measuring at least 9.4 cm. Suspect hepatic abscesses in the setting of clinically suspected cholecystitis/cholangitis. These lesions are not characteristic in appearance for hepatocellular carcinoma, although malignancy is not strictly excluded. 3. Cholelithiasis with severe gallbladder wall thickening and small volume pericholecystic fluid. Although nonspecific in the setting of ascites, findings are suspicious for acute cholecystitis. 4. Enlarged portacaval lymph nodes, nonspecific. 5. Small volume ascites throughout the abdomen. 6. Splenomegaly ? ?RUQ [place] -  Severe gallbladder wall  thickening is noted with mild cholelithiasis and sludge within gallbladder lumen concerning for cholecystitis. Some pericholecystic fluid is noted as well. Hepatic cirrhosis is again noted with the development of at least 3 solid masses within the liver, the largest measuring 5.6 cm in the right hepatic lobe. These are highly concerning for malignancy such as hepatocellular carcinoma or metastatic disease ? ?EGD ? ?   ? ?Disposition Plan  :   ? ?Status is: Inpatient ? ?DVT Prophylaxis  :   ? ?SCDs Start: 11/10/21 0403 ?  ? ?Lab Results  ?Component Value Date  ? PLT 55 (L) 11/11/2021  ? ? ?Diet :  ?Diet Order   ? ?       ?  Diet NPO time specified  Diet effective midnight       ?  ? ?  ?  ? ?  ?  ? ?Inpatient Medications ? ?Scheduled Meds: ? [MAR Hold] insulin aspart  0-9 Units Subcutaneous Q6H  ? [MAR Hold] lactulose  10 g Oral Daily  ? [MAR Hold] melatonin  3 mg Oral QHS  ? [MAR Hold] multivitamin with minerals  1 tablet Oral Daily  ? [MAR Hold] pantoprazole  40 mg Intravenous Q12H  ? [MAR Hold] propranolol  10 mg Oral TID  ? ?Continuous Infusions: ? sodium chloride 50 mL/hr at 11/11/21 0634  ? [MAR Hold] cefTRIAXone (ROCEPHIN)  IV Stopped (11/10/21 1934)  ? octreotide  (SANDOSTATIN)    IV infusion 50 mcg/hr (11/11/21 0226)  ? pantoprazole Stopped (11/11/21 [id number])  ? ?PRN Meds:.[MAR Hold] hydrOXYzine, [MAR Hold] ondansetron **OR** [MAR Hold] ondansetron (ZOFRAN) IV, [MAR Hold] traZODone ? ?Antibiotics  :   ? ?Anti-infectives (From admission, onward)  ? ? Start     Dose/Rate Route Frequency Ordered Stop  ? 11/10/21 0900  [MAR Hold]  cefTRIAXone (ROCEPHIN) 2 g in sodium chloride 0.9 % 100 mL IVPB        (MAR Hold  since Tue 11/11/2021 at 0732.Hold Reason: Transfer to a Procedural area)  ? 2 g ?200 mL/hr over 30 Minutes Intravenous Every 24 hours 11/10/21 0854    ? ?  ? ? ? Time Spent in minutes  30 ? ? ?Lala Lund M.D on 11/11/2021 at 8:47 AM ? ?To page go to [email or web address]  ? ?[facility] -  Office   [phone] ? ?See all Orders from today for further details ? ? ? Objective:  ? ?Vitals:  ? 11/11/21 0445 11/11/21 0500 11/11/21 0515 11/11/21 0734  ?BP: 106/62 113/68 112/69 (!) 137/56  ?Pulse: 66 80 72 70  ?Resp: 17 19 17 16   ?Temp:      ?TempSrc:      ?SpO2: 100% 100% 100% 94%  ?Weight:    63.5 kg  ?Height:    5\' 10"  (1.778 m)  ? ? ?Wt Readings from Last 3 Encounters:  ?11/11/21 63.5 kg  ?04/28/21 62.1 kg  ?04/08/21 62.1 kg  ? ? ? ?Intake/Output Summary (Last 24 hours) at 11/11/2021 0847 ?Last data filed at 11/11/2021 [phone] ?Gross per 24 hour  ?Intake 686.59 ml  ?Output 1200 ml  ?Net -513.41 ml  ? ? ? ?Physical Exam ? ?Awake Alert, No new F.N deficits, Normal affect ?[place].AT,PERRAL ?Supple Neck, No JVD,   ?Symmetrical Chest wall movement, Good air movement bilaterally, CTAB ?RRR,No Gallops, Rubs or new Murmurs,  ?+ve B.Sounds, Abd Soft, No tenderness,   ?No Cyanosis, Clubbing or edema  ?  ? ? Data Review:  ? ? ?CBC ?Recent Labs  ?Lab 11/10/21 ?0232 11/10/21 ?1355 11/10/21 ?1932 11/11/21 ?0336  ?WBC 14.2* 8.6 7.3 5.7  ?HGB 9.9* 9.8* 8.4* 8.0*  ?HCT 30.6* 29.2* 25.9* 23.4*  ?PLT 150 75* 55* 55*  ?MCV 84.8 85.1 86.9 84.2  ?MCH 27.4 28.6 28.2 28.8  ?MCHC 32.4 33.6 32.4 34.2  ?RDW 16.4* 15.8* 15.9* 16.0*  ?LYMPHSABS 0.5*  --   --  0.4*  ?MONOABS 1.2*  --   --  0.9  ?EOSABS 0.2  --   --  0.0  ?BASOSABS 0.1  --   --  0.0  ? ? ?Electrolytes ?Recent Labs  ?Lab 11/10/21 ?0232 11/10/21 ?1049 11/10/21 ?1154 11/11/21 ?0336  ?NA 135  --   --  136  ?K 4.8  --   --  4.1  ?CL 105  --   --  105  ?CO2 22  --   --  25  ?GLUCOSE 241*  --   --  187*  ?BUN 39*  --   --  25*  ?CREATININE 0.89  --   --  0.84  ?CALCIUM 9.5  --   --  8.4*  ?AST 508*  --   --  355*  ?ALT 514*  --   --  587*  ?ALKPHOS 261*  --   --  192*  ?BILITOT 1.3*  --   --  1.0  ?ALBUMIN 3.4*  --   --  2.8*  ?MG  --   --   --  1.6*  ?INR 1.1  --   --  1.3*  ?HGBA1C  --  5.6  --   --   ?AMMONIA  --   --  55*  --   ?BNP  --   --   --  54.9  ? ? ? ?Micro Results ?No results  found for this or any previous visit (from the past 240 hour(s)). ? ?Radiology Reports ?MR ABDOMEN MRCP W WO CONTAST ? ?Result Date:  11/10/2021 ?CLINICAL DATA:  Epigastric pain, elevated LFTs, cirrhosis, liver masse

## 2021-11-11 NOTE — Progress Notes (Signed)
Pt refusing bed and chair alarms at this time. Pt encouraged to maintain alarms due to high fall risk and educated on dangers of falls and fall prevention. Charge RN notified of situation.  ?

## 2021-11-11 NOTE — Progress Notes (Signed)
Inpatient Diabetes Program Recommendations ? ?AACE/ADA: New Consensus Statement on Inpatient Glycemic Control (2015) ? ?Target Ranges:  Prepandial:   less than 140 mg/dL ?     Peak postprandial:   less than 180 mg/dL (1-2 hours) ?     Critically ill patients:  140 - 180 mg/dL  ? ?Lab Results  ?Component Value Date  ? GLUCAP 184 (H) 11/11/2021  ? HGBA1C 5.6 11/10/2021  ? ? ?Review of Glycemic Control ? Latest Reference Range & Units 11/10/21 18:11 11/10/21 21:23 11/11/21 03:14 11/11/21 05:54 11/11/21 09:06  ?Glucose-Capillary 70 - 99 mg/dL 268 (H) 341 (H) 962 (H) 166 (H) 184 (H)  ? ?Diabetes history: DM 2 ?Outpatient Diabetes medications:  ?Amaryl 4 mg bid, Metformin 1500 mg in AM and 1000 mg q PM, Actos 45 mg daily ?Current orders for Inpatient glycemic control:  ?Novolog sensitive q 6 hours ? ?Inpatient Diabetes Program Recommendations:   ? ?Note PO diet started.  Consider adding CHO modified to diet as well. Consider changing to Novolog sensitive tid with meals and HS scale.   ? ?Thanks,  ?Beryl Meager, RN, BC-ADM ?Inpatient Diabetes Coordinator ?Pager [phone]   ? ? ?

## 2021-11-11 NOTE — ED Notes (Signed)
ED TO INPATIENT HANDOFF REPORT ? ?ED Nurse Name and Phone #: Baxter Flattery, RN ? ?S ?Name/Age/Gender ?[name] Ford ?61 y.o. ?male ?Room/Bed: 017C/017C ? ?Code Status ?  Code Status: Full Code ? ?Home/SNF/Other ?Home ?Patient oriented to: self, place, time, and situation ?Is this baseline? Yes  ? ?Triage Complete: Triage complete  ?Chief Complaint ?Upper GI bleeding [K92.2] ? ?Triage Note ?Pt c/o hematemesis, black tarry stools, palpitations, and weakness since 2300 last night. Pt has hx esophageal varices  ? ?Allergies ?Not on File ? ?Level of Care/Admitting Diagnosis ?ED Disposition   ? ? ED Disposition  ?Admit  ? Condition  ?--  ? Comment  ?Hospital Area: [facility] [id number] ? Level of Care: Progressive [102] ? Admit to Progressive based on following criteria: GI, ENDOCRINE disease patients with GI bleeding, acute liver failure or pancreatitis, stable with diabetic ketoacidosis or thyrotoxicosis (hypothyroid) state. ? May admit patient to [facility] or [facility] if equivalent level of care is available:: No ? Covid Evaluation: Asymptomatic - no recent exposure (last 10 days) testing not required ? Diagnosis: Upper GI bleeding [id number] ? Admitting Physician: Christel Mormon [id number] ? Attending Physician: Christel Mormon [id number] ? Estimated length of stay: past midnight tomorrow ? Certification:: I certify this patient will need inpatient services for at least 2 midnights ?  ?  ? ?  ? ? ?B ?Medical/Surgery History ?Past Medical History:  ?Diagnosis Date  ? Anxiety   ? CAD (coronary artery disease)   ? Nonobstructive by cath in [facility] reportedly in 2010 with 50% RCA and 50% ramus . Last adm 03/2009 with CP/mildly elevated troponin recommended for cath/echo but patient left AMA  ? Cirrhosis ([place])   ? History of abnormal LFTs. Hx of biopsy per patient. Pt also states negative for Hep C in the past.  ? Depression   ? Diabetes mellitus   ? Esophageal varices (HCC)   ? Gallbladder disease   ? GERD  (gastroesophageal reflux disease)   ? Hyperlipidemia   ? Myocardial infarction [facility])   ? 2013  ? Polysubstance abuse ([place])   ? Hx of cocaine, tobacco, marijuana use  ? ?Past Surgical History:  ?Procedure Laterality Date  ? KNEE SURGERY    ? L knee ACL/meniscus repair 2002  ?  ? ?A ?IV Location/Drains/Wounds ?Patient Lines/Drains/Airways Status   ? ? Active Line/Drains/Airways   ? ? Name Placement date Placement time Site Days  ? Peripheral IV 11/10/21 20 G Anterior;Left Forearm 11/10/21  0000  Forearm  1  ? Peripheral IV 11/10/21 20 G Anterior;Proximal;Right Forearm 11/10/21  1400  Forearm  1  ? ?  ?  ? ?  ? ? ?Intake/Output Last 24 hours ? ?Intake/Output Summary (Last 24 hours) at 11/11/2021 0725 ?Last data filed at 11/11/2021 [phone] ?Gross per 24 hour  ?Intake 686.59 ml  ?Output 1200 ml  ?Net -513.41 ml  ? ? ?Labs/Imaging ?Results for orders placed or performed during the hospital encounter of 11/10/21 (from the past 48 hour(s))  ?CBG monitoring, ED     Status: Abnormal  ? Collection Time: 11/10/21  2:23 AM  ?Result Value Ref Range  ? Glucose-Capillary 207 (H) 70 - 99 mg/dL  ?  Comment: Glucose reference range applies only to samples taken after fasting for at least 8 hours.  ?Lipase, blood     Status: None  ? Collection Time: 11/10/21  2:32 AM  ?Result Value Ref Range  ? Lipase 42 11 - 51 U/L  ?  Comment: Performed at [facility], [place] [address] [place], [place] [address]  ?Comprehensive metabolic panel     Status: Abnormal  ? Collection Time: 11/10/21  2:32 AM  ?Result Value Ref Range  ? Sodium 135 135 - 145 mmol/L  ? Potassium 4.8 3.5 - 5.1 mmol/L  ? Chloride 105 98 - 111 mmol/L  ? CO2 22 22 - 32 mmol/L  ? Glucose, Bld 241 (H) 70 - 99 mg/dL  ?  Comment: Glucose reference range applies only to samples taken after fasting for at least 8 hours.  ? BUN 39 (H) 8 - 23 mg/dL  ? Creatinine, Ser 0.89 0.61 - 1.24 mg/dL  ? Calcium 9.5 8.9 - 10.3 mg/dL  ? Total Protein 6.0 (L) 6.5 - 8.1 g/dL  ? Albumin 3.4 (L) 3.5 -  5.0 g/dL  ? AST 508 (H) 15 - 41 U/L  ? ALT 514 (H) 0 - 44 U/L  ? Alkaline Phosphatase 261 (H) 38 - 126 U/L  ? Total Bilirubin 1.3 (H) 0.3 - 1.2 mg/dL  ? GFR, Estimated >60 >60 mL/min  ?  Comment: (NOTE) ?Calculated using the CKD-EPI Creatinine Equation (2021) ?  ? Anion gap 8 5 - 15  ?  Comment: Performed at [facility], [place] [address] [place], [place] [address]  ?CBC with Differential     Status: Abnormal  ? Collection Time: 11/10/21  2:32 AM  ?Result Value Ref Range  ? WBC 14.2 (H) 4.0 - 10.5 K/uL  ? RBC 3.61 (L) 4.22 - 5.81 MIL/uL  ? Hemoglobin 9.9 (L) 13.0 - 17.0 g/dL  ? HCT 30.6 (L) 39.0 - 52.0 %  ? MCV 84.8 80.0 - 100.0 fL  ? MCH 27.4 26.0 - 34.0 pg  ? MCHC 32.4 30.0 - 36.0 g/dL  ? RDW 16.4 (H) 11.5 - 15.5 %  ? Platelets 150 150 - 400 K/uL  ? nRBC 0.0 0.0 - 0.2 %  ? Neutrophils Relative % 85 %  ? Neutro Abs 12.2 (H) 1.7 - 7.7 K/uL  ? Lymphocytes Relative 3 %  ? Lymphs Abs 0.5 (L) 0.7 - 4.0 K/uL  ? Monocytes Relative 9 %  ? Monocytes Absolute 1.2 (H) 0.1 - 1.0 K/uL  ? Eosinophils Relative 1 %  ? Eosinophils Absolute 0.2 0.0 - 0.5 K/uL  ? Basophils Relative 1 %  ? Basophils Absolute 0.1 0.0 - 0.1 K/uL  ? Immature Granulocytes 1 %  ? Abs Immature Granulocytes 0.14 (H) 0.00 - 0.07 K/uL  ?  Comment: Performed at [facility], [place] [address] [place], [place] [address]  ?Protime-INR     Status: None  ? Collection Time: 11/10/21  2:32 AM  ?Result Value Ref Range  ? Prothrombin Time 14.2 11.4 - 15.2 seconds  ? INR 1.1 0.8 - 1.2  ?  Comment: (NOTE) ?INR goal varies based on device and disease states. ?Performed at [facility], [place] [address], [place], [place] ?[address] ?  ?Type and screen [place]     Status: None (Preliminary result)  ? Collection Time: 11/10/21  2:34 AM  ?Result Value Ref Range  ? ABO/RH(D) A POS   ? Antibody Screen NEG   ? Sample Expiration 11/13/2021,2359   ? Unit Number [id number]   ? Blood Component Type RED CELLS,LR   ? Unit division 00   ? Status of  Unit ALLOCATED   ? Transfusion Status OK TO TRANSFUSE   ? Crossmatch Result  Compatible   ? Unit Number [id number]   ? Blood Component Type RED CELLS,LR   ? Unit division 00   ? Status of Unit ALLOCATED   ? Transfusion Status OK TO TRANSFUSE   ? Crossmatch Result Compatible   ? Unit Number [id number]   ? Blood Component Type RED CELLS,LR   ? Unit division 00   ? Status of Unit ISSUED,FINAL   ? Transfusion Status OK TO TRANSFUSE   ? Crossmatch Result    ?  Compatible ?Performed at [facility], [place] [address], [place], [place] [address] ?  ? Unit Number [id number]   ? Blood Component Type RED CELLS,LR   ? Unit division 00   ? Status of Unit ISSUED,FINAL   ? Transfusion Status OK TO TRANSFUSE   ? Crossmatch Result Compatible   ? Unit Number [id number]   ? Blood Component Type RED CELLS,LR   ? Unit division 00   ? Status of Unit ALLOCATED   ? Transfusion Status OK TO TRANSFUSE   ? Crossmatch Result Compatible   ? Unit Number [id number]   ? Blood Component Type RED CELLS,LR   ? Unit division 00   ? Status of Unit ALLOCATED   ? Transfusion Status OK TO TRANSFUSE   ? Crossmatch Result Compatible   ?Prepare RBC (crossmatch)     Status: None  ? Collection Time: 11/10/21  2:35 AM  ?Result Value Ref Range  ? Order Confirmation    ?  ORDER PROCESSED BY BLOOD BANK ?Performed at [facility], [place] [address], [place], [place] [address] ?  ?ABO/Rh     Status: None  ? Collection Time: 11/10/21  2:41 AM  ?Result Value Ref Range  ? ABO/RH(D)    ?  A POS ?Performed at [facility], [place] [address] [place], [place] [address] ?  ?Urinalysis, Routine w reflex microscopic     Status: None  ? Collection Time: 11/10/21  6:43 AM  ?Result Value Ref Range  ? Color, Urine YELLOW YELLOW  ? APPearance CLEAR CLEAR  ? Specific Gravity, Urine 1.023 1.005 - 1.030  ? pH 5.0 5.0 - 8.0  ? Glucose, UA NEGATIVE NEGATIVE mg/dL  ? Hgb urine dipstick NEGATIVE NEGATIVE  ? Bilirubin Urine NEGATIVE NEGATIVE  ? Ketones, ur  NEGATIVE NEGATIVE mg/dL  ? Protein, ur NEGATIVE NEGATIVE mg/dL  ? Nitrite NEGATIVE NEGATIVE  ? Leukocytes,Ua NEGATIVE NEGATIVE  ?  Comment: Performed at [facility], [place] [address] [place], [place] [address]  ?U

## 2021-11-11 NOTE — Anesthesia Postprocedure Evaluation (Signed)
Anesthesia Post Note ? ?Patient: [name] Ford ? ?Procedure(s) Performed: ESOPHAGOGASTRODUODENOSCOPY (EGD) WITH PROPOFOL ?ESOPHAGEAL BANDING ? ?  ? ?Patient location during evaluation: PACU ?Anesthesia Type: MAC ?Level of consciousness: awake and alert ?Pain management: pain level controlled ?Vital Signs Assessment: post-procedure vital signs reviewed and stable ?Respiratory status: spontaneous breathing, nonlabored ventilation, respiratory function stable and patient connected to nasal cannula oxygen ?Cardiovascular status: stable and blood pressure returned to baseline ?Postop Assessment: no apparent nausea or vomiting ?Anesthetic complications: no ? ? ?No notable events documented. ? ?Last Vitals:  ?Vitals:  ? 11/11/21 1100 11/11/21 1138  ?BP:  127/60  ?Pulse: 71 76  ?Resp: 13 13  ?Temp: 37.1 ?C 37.2 ?C  ?SpO2: 99% 100%  ?  ?Last Pain:  ?Vitals:  ? 11/11/21 1138  ?TempSrc: Oral  ?PainSc:   ? ? ?  ?  ?  ?  ?  ?  ? ?March Rummage Colyn Miron ? ? ? ? ?

## 2021-11-11 NOTE — ED Notes (Signed)
E-consent signed in computer ?

## 2021-11-11 NOTE — Progress Notes (Signed)
?  Transition of Care (TOC) Screening Note ? ? ?Patient Details  ?Name: [name] Ford ?Date of Birth: Mar 24, 1961 ? ? ?Transition of Care (TOC) CM/SW Contact:    ?Harriet Masson, RN ?Phone Number: ?11/11/2021, 8:39 AM ? ? ? ?Transition of Care Department [facility]) has reviewed patient and no TOC needs have been identified at this time. We will continue to monitor patient advancement through interdisciplinary progression rounds. If new patient transition needs arise, please place a TOC consult. ? ? ?

## 2021-11-11 NOTE — Progress Notes (Signed)
PT Cancellation Note ? ?Patient Details ?Name: [name] Ford ?MRN: [id number] ?DOB: 1960/09/26 ? ? ?Cancelled Treatment:    Reason Eval/Treat Not Completed: Patient at procedure or test/unavailable (Pt in endoscopy. will return as able.) ? ? ?[name] [name] ?11/11/2021, 8:42 AM ?[name],PT ?Acute Rehab Services ?[phone] ?[phone] (pager)  ? ? ?

## 2021-11-11 NOTE — Evaluation (Signed)
Physical Therapy Evaluation ?Patient Details ?Name: [name] Ford ?MRN: [id number] ?DOB: 1961/05/02 ?Today's Date: 11/11/2021 ? ?History of Present Illness ? Pt is 61 yo male admitted 11/10/21 with bloddy vomitus suggestive of upper GI bleed.  Pt received 2 units PRBC, EGD on 4/11.  Pt with hx of CAD, liver cirrhosis, depression, DM, esophageal varices, dyslipidemia, and polysubstance abuse (EtOH and cocaine in past, tobacco current) ?  ?Clinical Impression ? Pt admitted with above diagnosis. At baseline pt is independent.  He does report 2 falls in last 6 months at home due to loss of balance - neuropathy possibly playing a role.  Pt also had a fall in ED when trying to get to [facility] - he felt related to weak (low hgb) , tangled in lines, and BSC slid.  Today, pt ambulating 300' without AD - had 1 mild LOB initially when looking around but improved to near normal gait for remainder of walk.  Will benefit from acute PT to advance safety and balance but suspect no PT needs at d/c.  Pt currently with functional limitations due to the deficits listed below (see PT Problem List). Pt will benefit from skilled PT to increase their independence and safety with mobility to allow discharge to the venue listed below.   ?   ?   ? ?Recommendations for follow up therapy are one component of a multi-disciplinary discharge planning process, led by the attending physician.  Recommendations may be updated based on patient status, additional functional criteria and insurance authorization. ? ?Follow Up Recommendations No PT follow up ? ?  ?Assistance Recommended at Discharge None  ?Patient can return home with the following ?   ? ?  ?Equipment Recommendations None recommended by PT  ?Recommendations for Other Services ?    ?  ?Functional Status Assessment Patient has had a recent decline in their functional status and demonstrates the ability to make significant improvements in function in a reasonable and predictable amount of time.  ? ?   ?Precautions / Restrictions Precautions ?Precautions: Fall  ? ?  ? ?Mobility ? Bed Mobility ?Overal bed mobility: Needs Assistance ?Bed Mobility: Supine to Sit, Sit to Supine ?  ?  ?Supine to sit: Modified independent (Device/Increase time) ?Sit to supine: Modified independent (Device/Increase time) ?  ?  ?  ? ?Transfers ?Overall transfer level: Needs assistance ?Equipment used: None ?Transfers: Sit to/from Stand ?Sit to Stand: Supervision ?  ?  ?  ?  ?  ?  ?  ? ?Ambulation/Gait ?Ambulation/Gait assistance: Min guard ?Gait Distance (Feet): 300 Feet ?Assistive device: None ?Gait Pattern/deviations: Step-through pattern, Drifts right/left ?Gait velocity: decreased ?  ?  ?General Gait Details: Pt tends to drift R/L when looking around or distracted.  Had 1 episode staggering L initially requiring min A to recover but then improved for remainder of walk. ? ?Stairs ?  ?  ?  ?  ?  ? ?Wheelchair Mobility ?  ? ?Modified Rankin (Stroke Patients Only) ?  ? ?  ? ?Balance Overall balance assessment: Needs assistance, History of Falls ?Sitting-balance support: No upper extremity supported ?Sitting balance-Leahy Scale: Good ?  ?  ?Standing balance support: No upper extremity supported ?Standing balance-Leahy Scale: Fair ?  ?  ?  ?  ?  ?  ?  ?  ?  ?  ?  ?  ?   ? ? ? ?Pertinent Vitals/Pain Pain Assessment ?Pain Assessment: 0-10 ?Pain Score: 1  ?Pain Location: stomach and headache ?Pain Descriptors / Indicators:  Discomfort ?Pain Intervention(s): Limited activity within patient's tolerance, Monitored during session  ? ? ?Home Living Family/patient expects to be discharged to:: Private residence ?Living Arrangements: Alone ?Available Help at Discharge: Friend(s);Available PRN/intermittently ?Type of Home: House ?Home Access: Stairs to enter ?Entrance Stairs-Rails: Left;Right ?Entrance Stairs-Number of Steps: 5 ?  ?Home Layout: One level ?Home Equipment: Shower seat - built in ?   ?  ?Prior Function Prior Level of Function :  Independent/Modified Independent;Driving;History of Falls (last six months) ?  ?  ?  ?  ?  ?  ?Mobility Comments: Reports could ambulate in community; has had 2 falls 6 months due to loss of balance - does have neuropathy ?  ?  ? ? ?Hand Dominance  ?   ? ?  ?Extremity/Trunk Assessment  ? Upper Extremity Assessment ?Upper Extremity Assessment: Overall WFL for tasks assessed ?  ? ?Lower Extremity Assessment ?Lower Extremity Assessment: Overall WFL for tasks assessed (Does have decreased sensation bil feet - neuropathy) ?  ? ?Cervical / Trunk Assessment ?Cervical / Trunk Assessment: Normal  ?Communication  ? Communication: No difficulties  ?Cognition Arousal/Alertness: Awake/alert ?Behavior During Therapy: [facility] Hights for tasks assessed/performed ?Overall Cognitive Status: Within Functional Limits for tasks assessed ?  ?  ?  ?  ?  ?  ?  ?  ?  ?  ?  ?  ?  ?  ?  ?  ?  ?  ?  ? ?  ?General Comments   ? ?  ?Exercises    ? ?Assessment/Plan  ?  ?PT Assessment Patient needs continued PT services  ?PT Problem List Decreased mobility;Decreased balance;Decreased knowledge of use of DME;Impaired sensation ? ?   ?  ?PT Treatment Interventions DME instruction;Therapeutic activities;Gait training;Patient/family education;Stair training;Balance training;Functional mobility training   ? ?PT Goals (Current goals can be found in the Care Plan section)  ?Acute Rehab PT Goals ?Patient Stated Goal: return home ?PT Goal Formulation: With patient ?Time For Goal Achievement: 11/25/21 ?Potential to Achieve Goals: Good ?Additional Goals ?Additional Goal #1: Pt will score >19 on DGI to indicate low fall risk ? ?  ?Frequency Min 3X/week ?  ? ? ?Co-evaluation   ?  ?  ?  ?  ? ? ?  ?AM-PAC PT "6 Clicks" Mobility  ?Outcome Measure Help needed turning from your back to your side while in a flat bed without using bedrails?: None ?Help needed moving from lying on your back to sitting on the side of a flat bed without using bedrails?: None ?Help needed moving to  and from a bed to a chair (including a wheelchair)?: A Little ?Help needed standing up from a chair using your arms (e.g., wheelchair or bedside chair)?: A Little ?Help needed to walk in hospital room?: A Little ?Help needed climbing 3-5 steps with a railing? : A Little ?6 Click Score: 20 ? ?  ?End of Session Equipment Utilized During Treatment: Gait belt ?Activity Tolerance: Patient tolerated treatment well ?Patient left: in bed;with call bell/phone within reach;with bed alarm set ?Nurse Communication: Mobility status;Other (comment) (IV beeping) ?PT Visit Diagnosis: Unsteadiness on feet (R26.81);History of falling (Z91.81) ?  ? ?Time: 2355-7322 ?PT Time Calculation (min) (ACUTE ONLY): 20 min ? ? ?Charges:   PT Evaluation ?$PT Eval Low Complexity: 1 Low ?  ?  ?   ? ? ?Anise Salvo, PT ?Acute Rehab Services ?Pager [phone] ?[facility] [phone] ? ? ?Billey Chang Prarthana Parlin ?11/11/2021, 2:12 PM ? ?

## 2021-11-11 NOTE — Op Note (Signed)
[facility] ?Patient Name: [name] Ford ?Procedure Date : 11/11/2021 ?MRN: [id number] ?Attending MD: Dub Amis. Tomasa Rand , MD ?Date of Birth: 06/28/1961 ?CSN: [id number] ?Age: 61 ?Admit Type: Inpatient ?Procedure:                Upper GI endoscopy ?Indications:              Hematemesis, Melena ?Providers:                Dub Amis. Tomasa Rand, MD, Willy Eddy, RN, [place]  ?                          [place], Technician, Armida Sans, CRNA ?Referring MD:             Jeani Hawking, MD ?Medicines:                Monitored Anesthesia Care ?Complications:            No immediate complications. ?Estimated Blood Loss:     Estimated blood loss: none. ?Procedure:                Pre-Anesthesia Assessment: ?                          - Prior to the procedure, a History and Physical  ?                          was performed, and patient medications and  ?                          allergies were reviewed. The patient's tolerance of  ?                          previous anesthesia was also reviewed. The risks  ?                          and benefits of the procedure and the sedation  ?                          options and risks were discussed with the patient.  ?                          All questions were answered, and informed consent  ?                          was obtained. Prior Anticoagulants: The patient has  ?                          taken no previous anticoagulant or antiplatelet  ?                          agents. ASA Grade Assessment: III - A patient with  ?                          severe systemic disease. After reviewing the risks  ?  and benefits, the patient was deemed in  ?                          satisfactory condition to undergo the procedure. ?                          After obtaining informed consent, the endoscope was  ?                          passed under direct vision. Throughout the  ?                          procedure, the patient's blood pressure, pulse, and  ?                           oxygen saturations were monitored continuously. The  ?                          [id number] ([id number] Olympus endoscope was introduced  ?                          through the mouth, and advanced to the third part  ?                          of duodenum. The upper GI endoscopy was  ?                          accomplished without difficulty. The patient  ?                          tolerated the procedure well. ?Scope In: ?Scope Out: ?Findings: ?     The examined portions of the nasopharynx, oropharynx and larynx were  ?     normal. ?     Grade II varices were found in the lower third of the esophagus. They  ?     were medium in size. Three bands were successfully placed with complete  ?     eradication, resulting in deflation of varices. There was no bleeding  ?     during the maneuver. ?     The exam of the esophagus was otherwise normal. ?     Moderate portal hypertensive gastropathy was found in the gastric body. ?     The exam of the stomach was otherwise normal. ?     The examined duodenum was normal. ?Impression:               - The examined portions of the nasopharynx,  ?                          oropharynx and larynx were normal. ?                          - Grade II esophageal varices treated. Completely  ?                          eradicated following banding x 3. Although no  ?  stigmata seen on the varices, there were no other  ?                          potential sources of abrupt hematemesis/melena.  ?                          Although portal hypertensive gastropathy can cause  ?                          upper GI bleeding, I suspect his hemorrhage was  ?                          secondary to esophageal varices ?                          - Portal hypertensive gastropathy. ?                          - Normal examined duodenum. ?                          - No specimens collected. ?Recommendation:           - Return patient to hospital ward for ongoing care. ?                           - Advance diet as tolerated. ?                          - Repeat upper endoscopy in 4 weeks for retreatment. ?                          - Patient will need to start non-selective beta  ?                          blocker after discharge to reduce risk of recurrent  ?                          bleeding. He previously did not tolerate  ?                          propranolol. Will try carvedilol next, but this can  ?                          wait until outpatient. ?                          - Continue octreotide drip today and then stop  ?                          tomorrow if no evidence of rebleed ?                          - Ok to change PPI to PO once daily ?                          -  Continue ceftriaxone today ?                          - Given MRI findings with possible hepatic  ?                          abscesses vs malignancy, obtain blood cultures.  ?                          Tumor markers pending ?                          - Will plan for potential colonoscopy prior to  ?                          discharge to exclude IBD and CRC ?Procedure Code(s):        --- Professional --- ?                          [phone], Esophagogastroduodenoscopy, flexible,  ?                          transoral; with band ligation of esophageal/gastric  ?                          varices ?Diagnosis Code(s):        --- Professional --- ?                          I85.00, Esophageal varices without bleeding ?                          K76.6, Portal hypertension ?                          K31.89, Other diseases of stomach and duodenum ?                          K92.0, Hematemesis ?                          K92.1, Melena (includes Hematochezia) ?CPT copyright 2019 [employer] Medical Association. All rights reserved. ?The codes documented in this report are preliminary and upon coder review may  ?be revised to meet current compliance requirements. ?Cristal Howatt E. Tomasa Rand, MD ?11/11/2021 8:57:00 AM ?This report has been signed  electronically. ?Number of Addenda: 0 ?

## 2021-11-11 NOTE — Transfer of Care (Signed)
Immediate Anesthesia Transfer of Care Note ? ?Patient: [name] Ford ? ?Procedure(s) Performed: ESOPHAGOGASTRODUODENOSCOPY (EGD) WITH PROPOFOL ?ESOPHAGEAL BANDING ? ?Patient Location: PACU ? ?Anesthesia Type:MAC ? ?Level of Consciousness: drowsy and patient cooperative ? ?Airway & Oxygen Therapy: Patient Spontanous Breathing and Patient connected to nasal cannula oxygen ? ?Post-op Assessment: Report given to RN and Post -op Vital signs reviewed and stable ? ?Post vital signs: Reviewed and stable ? ?Last Vitals:  ?Vitals Value Taken Time  ?BP 119/41 11/11/21 0848  ?Temp    ?Pulse 68 11/11/21 0850  ?Resp 19 11/11/21 0850  ?SpO2 100 % 11/11/21 0850  ?Vitals shown include unvalidated device data. ? ?Last Pain:  ?Vitals:  ? 11/11/21 0734  ?TempSrc:   ?PainSc: 3   ?   ? ?  ? ?Complications: No notable events documented. ?

## 2021-11-11 NOTE — Progress Notes (Signed)
Pt arrived to unit via transport. Pt transferred from stretcher to bed minimal assist x1.  ? ?

## 2021-11-11 NOTE — Interval H&P Note (Signed)
History and Physical Interval Note: ? ?11/11/2021 ?8:00 AM ? ?[name] Ford  has presented today for surgery, with the diagnosis of Cirrhosis, GI bleed, esophageal varices.  The various methods of treatment have been discussed with the patient and family. After consideration of risks, benefits and other options for treatment, the patient has consented to  Procedure(s): ?ESOPHAGOGASTRODUODENOSCOPY (EGD) WITH PROPOFOL (N/A) as a surgical intervention.  The patient's history has been reviewed, patient examined, no change in status, stable for surgery.  I have reviewed the patient's chart and labs.  Questions were answered to the patient's satisfaction.   ? ?Patient's hgb dropped to 8 this morning.  BUN improving.  No further episodes of hematemesis yeserday.  2 episodes of melena since I saw him yesterday morning. ? ?[name] Ford ? ? ?

## 2021-11-11 NOTE — ED Notes (Signed)
Request for medication sent to pharmacy

## 2021-11-12 ENCOUNTER — Encounter (HOSPITAL_COMMUNITY): Payer: Self-pay | Admitting: Gastroenterology

## 2021-11-12 DIAGNOSIS — R7989 Other specified abnormal findings of blood chemistry: Secondary | ICD-10-CM | POA: Diagnosis not present

## 2021-11-12 DIAGNOSIS — K766 Portal hypertension: Secondary | ICD-10-CM | POA: Diagnosis not present

## 2021-11-12 DIAGNOSIS — K7460 Unspecified cirrhosis of liver: Secondary | ICD-10-CM | POA: Diagnosis not present

## 2021-11-12 DIAGNOSIS — I8500 Esophageal varices without bleeding: Secondary | ICD-10-CM

## 2021-11-12 DIAGNOSIS — K743 Primary biliary cirrhosis: Secondary | ICD-10-CM | POA: Diagnosis not present

## 2021-11-12 DIAGNOSIS — K922 Gastrointestinal hemorrhage, unspecified: Secondary | ICD-10-CM | POA: Diagnosis not present

## 2021-11-12 DIAGNOSIS — K750 Abscess of liver: Secondary | ICD-10-CM | POA: Diagnosis not present

## 2021-11-12 DIAGNOSIS — I8511 Secondary esophageal varices with bleeding: Secondary | ICD-10-CM | POA: Diagnosis not present

## 2021-11-12 LAB — CBC WITH DIFFERENTIAL/PLATELET
Abs Immature Granulocytes: 0.02 10*3/uL (ref 0.00–0.07)
Basophils Absolute: 0 10*3/uL (ref 0.0–0.1)
Basophils Relative: 0 %
Eosinophils Absolute: 0.1 10*3/uL (ref 0.0–0.5)
Eosinophils Relative: 2 %
HCT: 23.5 % — ABNORMAL LOW (ref 39.0–52.0)
Hemoglobin: 7.7 g/dL — ABNORMAL LOW (ref 13.0–17.0)
Immature Granulocytes: 0 %
Lymphocytes Relative: 7 %
Lymphs Abs: 0.4 10*3/uL — ABNORMAL LOW (ref 0.7–4.0)
MCH: 28.2 pg (ref 26.0–34.0)
MCHC: 32.8 g/dL (ref 30.0–36.0)
MCV: 86.1 fL (ref 80.0–100.0)
Monocytes Absolute: 0.9 10*3/uL (ref 0.1–1.0)
Monocytes Relative: 19 %
Neutro Abs: 3.3 10*3/uL (ref 1.7–7.7)
Neutrophils Relative %: 72 %
Platelets: 59 10*3/uL — ABNORMAL LOW (ref 150–400)
RBC: 2.73 MIL/uL — ABNORMAL LOW (ref 4.22–5.81)
RDW: 16.3 % — ABNORMAL HIGH (ref 11.5–15.5)
WBC: 4.7 10*3/uL (ref 4.0–10.5)
nRBC: 0 % (ref 0.0–0.2)

## 2021-11-12 LAB — GLUCOSE, CAPILLARY
Glucose-Capillary: 169 mg/dL — ABNORMAL HIGH (ref 70–99)
Glucose-Capillary: 196 mg/dL — ABNORMAL HIGH (ref 70–99)
Glucose-Capillary: 222 mg/dL — ABNORMAL HIGH (ref 70–99)
Glucose-Capillary: 230 mg/dL — ABNORMAL HIGH (ref 70–99)
Glucose-Capillary: 268 mg/dL — ABNORMAL HIGH (ref 70–99)

## 2021-11-12 LAB — COMPREHENSIVE METABOLIC PANEL
ALT: 440 U/L — ABNORMAL HIGH (ref 0–44)
AST: 190 U/L — ABNORMAL HIGH (ref 15–41)
Albumin: 2.6 g/dL — ABNORMAL LOW (ref 3.5–5.0)
Alkaline Phosphatase: 166 U/L — ABNORMAL HIGH (ref 38–126)
Anion gap: 5 (ref 5–15)
BUN: 18 mg/dL (ref 8–23)
CO2: 25 mmol/L (ref 22–32)
Calcium: 7.8 mg/dL — ABNORMAL LOW (ref 8.9–10.3)
Chloride: 106 mmol/L (ref 98–111)
Creatinine, Ser: 0.71 mg/dL (ref 0.61–1.24)
GFR, Estimated: >60 mL/min (ref >60)
Glucose, Bld: 132 mg/dL — ABNORMAL HIGH (ref 70–99)
Potassium: 4 mmol/L (ref 3.5–5.1)
Sodium: 136 mmol/L (ref 135–145)
Total Bilirubin: 0.6 mg/dL (ref 0.3–1.2)
Total Protein: 5 g/dL — ABNORMAL LOW (ref 6.5–8.1)

## 2021-11-12 LAB — CBC
HCT: 26.3 % — ABNORMAL LOW (ref 39.0–52.0)
Hemoglobin: 8.8 g/dL — ABNORMAL LOW (ref 13.0–17.0)
MCH: 28.3 pg (ref 26.0–34.0)
MCHC: 33.5 g/dL (ref 30.0–36.0)
MCV: 84.6 fL (ref 80.0–100.0)
Platelets: 72 10*3/uL — ABNORMAL LOW (ref 150–400)
RBC: 3.11 MIL/uL — ABNORMAL LOW (ref 4.22–5.81)
RDW: 16.4 % — ABNORMAL HIGH (ref 11.5–15.5)
WBC: 10 10*3/uL (ref 4.0–10.5)
nRBC: 0 % (ref 0.0–0.2)

## 2021-11-12 LAB — MAGNESIUM: Magnesium: 1.8 mg/dL (ref 1.7–2.4)

## 2021-11-12 LAB — CEA: CEA: 2.7 ng/mL (ref 0.0–4.7)

## 2021-11-12 LAB — CANCER ANTIGEN 19-9: CA 19-9: 15 U/mL (ref 0–35)

## 2021-11-12 LAB — PROCALCITONIN: Procalcitonin: 0.89 ng/mL

## 2021-11-12 LAB — BRAIN NATRIURETIC PEPTIDE: B Natriuretic Peptide: 187.5 pg/mL — ABNORMAL HIGH (ref 0.0–100.0)

## 2021-11-12 LAB — TROPONIN I (HIGH SENSITIVITY)
Troponin I (High Sensitivity): 30 ng/L — ABNORMAL HIGH (ref <18)
Troponin I (High Sensitivity): 34 ng/L — ABNORMAL HIGH (ref <18)

## 2021-11-12 MED ORDER — TRAMADOL HCL 50 MG PO TABS
50.0000 mg | ORAL_TABLET | Freq: Four times a day (QID) | ORAL | Status: DC | PRN
  Filled 2021-11-12: qty 1

## 2021-11-12 MED ORDER — INSULIN GLARGINE-YFGN 100 UNIT/ML ~~LOC~~ SOLN
12.0000 [IU] | Freq: Every day | SUBCUTANEOUS | Status: DC
  Administered 2021-11-12 – 2021-11-13 (×2): 12 [IU] via SUBCUTANEOUS
  Filled 2021-11-12 (×3): qty 0.12

## 2021-11-12 NOTE — Progress Notes (Addendum)
? ? ? ?[facility] Progress Note ? ?CC:  GI bleed, cirrhosis ? ?Subjective: He reports feeling better today. No upper abdominal pain. No N/V. He passed a soft brown stool this afternoon, no black stools or rectal bleeding. He getting ready to ambulate in the hallway.  ? ?Objective:  ?Vital signs in last 24 hours: ?Temp:  [98.7 ?F (37.1 ?C)-100.5 ?F (38.1 ?C)] 98.9 ?F (37.2 ?C) (04/12 1205) ?Pulse Rate:  [68-85] 68 (04/12 1205) ?Resp:  [12-19] 16 (04/12 1205) ?BP: (101-154)/(55-97) 104/58 (04/12 1205) ?SpO2:  [95 %-100 %] 97 % (04/12 1205) ?Last BM Date : 11/12/21 ?General:   Alert, well-developed in NAD. ?Heart: RRR, systolic murmur.  ?Pulm: Breath sounds clear throughout.  ?Abdomen: Soft, protuberant. No ascites. Nontender. Umbilical hernia. + BS x 4 quads. ?Extremities:  No edema. ?Neurologic:  Alert and  oriented x 4. Grossly normal neurologically. ?Psych:  Alert and cooperative. Normal mood and affect. ? ?Intake/Output from previous day: ?04/11 0701 - 04/12 0700 ?In: 1747.8 [I.V.:1747.8] ?Out: 950 [Urine:950] ?Intake/Output this shift: ?Total I/O ?In: -  ?Out: 450 [Urine:450] ? ?Lab Results: ?Recent Labs  ?  11/11/21 ?1822 11/12/21 ?0237 11/12/21 ?1009  ?WBC 5.2 4.7 10.0  ?HGB 7.8* 7.7* 8.8*  ?HCT 23.9* 23.5* 26.3*  ?PLT 53* 59* 72*  ? ?BMET ?Recent Labs  ?  11/10/21 ?0232 11/11/21 ?0336 11/11/21 ?1822 11/12/21 ?0237  ?NA 135 136  --  136  ?K 4.8 4.1  --  4.0  ?CL 105 105  --  106  ?CO2 22 25  --  25  ?GLUCOSE 241* 187* 393* 132*  ?BUN 39* 25*  --  18  ?CREATININE 0.89 0.84  --  0.71  ?CALCIUM 9.5 8.4*  --  7.8*  ? ?LFT ?Recent Labs  ?  11/12/21 ?0237  ?PROT 5.0*  ?ALBUMIN 2.6*  ?AST 190*  ?ALT 440*  ?ALKPHOS 166*  ?BILITOT 0.6  ? ?PT/INR ?Recent Labs  ?  11/10/21 ?0232 11/11/21 ?0336  ?LABPROT 14.2 15.8*  ?INR 1.1 1.3*  ? ?Hepatitis Panel ?Recent Labs  ?  11/10/21 ?1049  ?HEPBSAG NON REACTIVE  ?HCVAB NON REACTIVE  ?HEPAIGM NON REACTIVE  ?HEPBIGM NON REACTIVE  ? ? ?MR ABDOMEN MRCP W WO  CONTAST ? ?Result Date: 11/10/2021 ?CLINICAL DATA:  Epigastric pain, elevated LFTs, cirrhosis, liver masses EXAM: MRI ABDOMEN WITHOUT AND WITH CONTRAST (INCLUDING MRCP) TECHNIQUE: Multiplanar multisequence MR imaging of the abdomen was performed both before and after the administration of intravenous contrast. Heavily T2-weighted images of the biliary and pancreatic ducts were obtained, and three-dimensional MRCP images were rendered by post processing. CONTRAST:  22m GADAVIST GADOBUTROL 1 MMOL/ML IV SOLN COMPARISON:  Same day abdominal ultrasound, 11/10/2021 FINDINGS: Lower chest: No acute findings. Hepatobiliary: Extremely heterogeneous, nodular cirrhotic liver with extensive dysplastic/regenerative nodularity throughout. In the posterior liver dome and posterior right lobe of the liver, there are multiple closely adjacent, internal fluid signal, heterogeneously rim enhancing lesions, largest of hepatic segment VII measuring at least 9.4 x 4.3 cm (series 1004, image 41). Additional satellite lesions anteriorly measuring up to 1.4 x 1.4 cm (series 1003, image 46) and superiorly in the posterior liver dome measuring 1.7 x 1.4 cm (series 1003, image 34). Small gallstones in the dependent gallbladder. Severe gallbladder wall thickening and small volume pericholecystic fluid (series 2, image 16). No biliary ductal dilatation. Pancreas: No mass, inflammatory changes, or other parenchymal abnormality identified.No pancreatic ductal dilatation. Spleen:  Splenomegaly, maximum coronal span 16.6 cm. Adrenals/Urinary Tract: Normal adrenal glands. No  renal masses or suspicious contrast enhancement identified. No evidence of hydronephrosis. Stomach/Bowel: Visualized portions within the abdomen are unremarkable. Vascular/Lymphatic: Enlarged portacaval lymph nodes measuring up to 2.8 x 1.4 cm (series 1004, image 68). The portal and hepatic veins are patent. No abdominal aortic aneurysm demonstrated. Other:  Small volume ascites  throughout the abdomen. Musculoskeletal: No suspicious osseous lesions identified. IMPRESSION: 1. Extremely heterogeneous, nodular cirrhotic liver with extensive dysplastic/regenerative nodularity throughout. 2. In the posterior liver dome and posterior right lobe of the liver, there are multiple closely adjacent, heterogeneously rim enhancing, internal fluid signal lesions, largest of which measuring at least 9.4 cm. Suspect hepatic abscesses in the setting of clinically suspected cholecystitis/cholangitis. These lesions are not characteristic in appearance for hepatocellular carcinoma, although malignancy is not strictly excluded. 3. Cholelithiasis with severe gallbladder wall thickening and small volume pericholecystic fluid. Although nonspecific in the setting of ascites, findings are suspicious for acute cholecystitis. 4. Enlarged portacaval lymph nodes, nonspecific. 5. Small volume ascites throughout the abdomen. 6. Splenomegaly. Electronically Signed   By: Delanna Ahmadi M.D.   On: 11/10/2021 15:47   ? ?Assessment / Plan: ? ?63) 61 year old male with a history of primary sclerosing cirrhosis with esophageal varices and portal hypertensive gastropathy presented to the ED 11/10/2021 with evaded LFTs, hematemesis and black stools. LFTs drifting downward since admission.  Total bili 0.6.  Alk phos 166.  AST 190.  ALT 440.  EGD 11/11/2021 identified grade 2 esophageal varices eradicated following band placement x 3 and portal hypertensive gastropathy.  No stigmata of recent esophageal bleeding was identified, suspect upper GI bleed likely due to esophageal varices.  No further hematemesis.  No further melena. No evidence of hepatic encephalopathy. ?-Pantoprazole 40 mg p.o. daily ?-Repeat EGD in 4 weeks for repeat esophageal varices banding ?-2 g low-sodium diet ?-Stop Octreotide infusion ?-Continue Rocephin IV for now ?-CBC, BMP and hepatic panel in a.m. ? ?2) Hepatic abscesses versus malignancy  (in the setting of  [place] cirrhosis) per abdominal imaging. RUQ sono 11/10/2021 identified severe gallbladder wall thickening with mild cholelithiasis and sludge within the gallbladder lumen possibly indicating cholecystitis, 3 solid masses in the right hepatic lobe, the largest mass measured at 5.6 cm concerning for hepatocellular carcinoma or metastatic disease.  Abdominal MRI/MRCP with contrast identified multiple closely adjacent heterogeneously rim-enhancing hepatic lesions, the largest measuring 9.4 cm concerning for hepatic abscesses.  I spoke with radiologist Dr. Laqueta Carina who thought his liver masses were atypical and not characteristic of [place] and likely indolent fluid-filled abscesses, however, malignancy was not strictly excluded.  Blood cultures no growth after 24 hrs. AFP < 1.8.  IR consult for aspiration/cx verses biopsy of the largest hepatic lesion.  WBC 4.7.  Afebrile.  No abdominal pain which is atypical presentation for liver abscesses. ?-IR consulted, plan to aspirate vs biopsy the largest liver lesion.  ?-May require ID consult if hepatic abscesses confirmed ?-Consider potential colonoscopy prior to discharge to exclude IBD and CRC ?  ?3) Anemia, secondary to # 1. Hg 9.9 (baseline Hg 12.5 on 03/19/2021). Transfused 2 units of PRBCs -> Hg 9.8 -> 8.4 -> 7.7. He passed a brown soft stool this afternoon. Hemodynamically stable.  ?-Monitor H&H closely ?-Transfuse for hemoglobin less than 7 ?  ?4) Nonobstructive coronary artery disease ?  ?5) Diabetes mellitus type 2 ?  ?6) History Polysubstance abuse (cocaine, tobacco and marijuana) ?  ?  ? ?Principal Problem: ?  Upper GI bleeding ?Active Problems: ?  Hepatic cirrhosis ([place]) ?  Polysubstance abuse (  [place]) ?  Type 2 diabetes mellitus without complications ([place]) ?  Esophageal varices determined by endoscopy ([place]) ?  Primary sclerosing cholangitis ? ? ? ? LOS: 2 days  ? ?Noralyn Pick  11/12/2021, 1:13 PM ? ?I have taken a history, reviewed the chart and examined the  patient. I performed a substantive portion of this encounter, including complete performance of at least one of the key components, in conjunction with the APP. I agree with the APP's note, impression and recommendations

## 2021-11-12 NOTE — Progress Notes (Signed)
Physical Therapy Treatment ?Patient Details ?Name: [name] Ford ?MRN: [id number] ?DOB: [birth year] ?Today's Date: 11/12/2021 ? ? ?History of Present Illness Pt is 61 yo male admitted 11/10/21 with bloody vomitus suggestive of upper GI bleed.  Pt received 2 units PRBC, EGD on 4/11.  Pt with hx of CAD, liver cirrhosis, depression, DM, esophageal varices, dyslipidemia, and polysubstance abuse (EtOH and cocaine in past, tobacco current) ? ?  ?PT Comments  ? ? Pt was up to walk with monitoring of vitals, and noted his Sats remained 98-100%, and HR was in 70's.  Pt is having some anxiety about impending liver aspiration vs biopsy, and reminded him that nurse practitioner was very positive when she came by.  Pt is in minimal pain, and is anxious to go  home when all testing is done.  Follow along for progression of balance and safety awareness as he works to make his gait mod I.  Follow all acute PT goals as his POC outlines, with updating as his progress merits.   ?Recommendations for follow up therapy are one component of a multi-disciplinary discharge planning process, led by the attending physician.  Recommendations may be updated based on patient status, additional functional criteria and insurance authorization. ? ?Follow Up Recommendations ? No PT follow up ?  ?  ?Assistance Recommended at Discharge None  ?Patient can return home with the following A little help with walking and/or transfers;A little help with bathing/dressing/bathroom;Assistance with cooking/housework;Assist for transportation;Help with stairs or ramp for entrance ?  ?Equipment Recommendations ? None recommended by PT  ?  ?Recommendations for Other Services   ? ? ?  ?Precautions / Restrictions Precautions ?Precautions: Fall ?Precaution Comments: GI bleed ?Restrictions ?Weight Bearing Restrictions: No  ?  ? ?Mobility ? Bed Mobility ?Overal bed mobility: Needs Assistance ?Bed Mobility: Supine to Sit, Sit to Supine ?  ?  ?Supine to sit: Supervision ?Sit  to supine: Supervision ?  ?  ?  ? ?Transfers ?Overall transfer level: Needs assistance ?Equipment used: None ?Transfers: Sit to/from Stand ?Sit to Stand: Supervision ?  ?  ?  ?  ?  ?General transfer comment: supervision and help with lines ?  ? ?Ambulation/Gait ?Ambulation/Gait assistance: Min guard ?Gait Distance (Feet): 350 Feet ?Assistive device: None ?Gait Pattern/deviations: Step-through pattern, Drifts right/left ?Gait velocity: decreased ?Gait velocity interpretation: <1.31 ft/sec, indicative of household ambulator ?  ?General Gait Details: lateral LOB but reaches his hand out to correct ? ? ?Stairs ?  ?  ?  ?  ?  ? ? ?Wheelchair Mobility ?  ? ?Modified Rankin (Stroke Patients Only) ?  ? ? ?  ?Balance Overall balance assessment: Needs assistance, History of Falls ?Sitting-balance support: Feet supported ?Sitting balance-Leahy Scale: Good ?  ?  ?Standing balance support: During functional activity, Single extremity supported, No upper extremity supported ?Standing balance-Leahy Scale: Fair ?  ?  ?  ?  ?  ?  ?  ?  ?  ?  ?  ?  ?  ? ?  ?Cognition Arousal/Alertness: Awake/alert ?Behavior During Therapy: [facility] for tasks assessed/performed ?Overall Cognitive Status: No family/caregiver present to determine baseline cognitive functioning ?  ?  ?  ?  ?  ?  ?  ?  ?  ?  ?  ?  ?  ?  ?  ?  ?General Comments: pt is struggling a little with information from NP regarding possible things to be found with liver testing ?  ?  ? ?  ?Exercises   ? ?  ?  General Comments General comments (skin integrity, edema, etc.): pt has lesions on his liver and will either have them aspirated or biopsied depending on the possibility ?  ?  ? ?Pertinent Vitals/Pain Pain Assessment ?Pain Assessment: Faces ?Faces Pain Scale: Hurts a little bit ?Pain Location: abdomen  ? ? ?Home Living   ?  ?  ?  ?  ?  ?  ?  ?  ?  ?   ?  ?Prior Function    ?  ?  ?   ? ?PT Goals (current goals can now be found in the care plan section) Acute Rehab PT Goals ?Patient  Stated Goal: return home ?Progress towards PT goals: Progressing toward goals ? ?  ?Frequency ? ? ? Min 3X/week ? ? ? ?  ?PT Plan Current plan remains appropriate  ? ? ?Co-evaluation   ?  ?  ?  ?  ? ?  ?AM-PAC PT "6 Clicks" Mobility   ?Outcome Measure ? Help needed turning from your back to your side while in a flat bed without using bedrails?: None ?Help needed moving from lying on your back to sitting on the side of a flat bed without using bedrails?: None ?Help needed moving to and from a bed to a chair (including a wheelchair)?: None ?Help needed standing up from a chair using your arms (e.g., wheelchair or bedside chair)?: A Little ?Help needed to walk in hospital room?: A Little ?Help needed climbing 3-5 steps with a railing? : A Little ?6 Click Score: 21 ? ?  ?End of Session Equipment Utilized During Treatment: Gait belt ?Activity Tolerance: Patient tolerated treatment well ?Patient left: in bed;with call bell/phone within reach;with bed alarm set ?Nurse Communication: Mobility status (IV had ended) ?PT Visit Diagnosis: Unsteadiness on feet (R26.81);History of falling (Z91.81) ?  ? ? ?Time: 1829-9371 ?PT Time Calculation (min) (ACUTE ONLY): 23 min ? ?Charges:  $Gait Training: 8-22 mins ?$Therapeutic Activity: 8-22 mins      ?Ivar Drape ?11/12/2021, 5:02 PM ? ?Samul Dada, PT PhD ?Acute Rehab Dept. Number: [facility] [phone] and [facility] [phone] ? ? ?

## 2021-11-12 NOTE — Evaluation (Signed)
Occupational Therapy Evaluation ?Patient Details ?Name: [name] Ford ?MRN: [id number] ?DOB: [birth year] ?Today's Date: 11/12/2021 ? ? ?History of Present Illness Pt is 61 yo male admitted 11/10/21 with bloddy vomitus suggestive of upper GI bleed.  Pt received 2 units PRBC, EGD on 4/11.  Pt with hx of CAD, liver cirrhosis, depression, DM, esophageal varices, dyslipidemia, and polysubstance abuse (EtOH and cocaine in past, tobacco current)  ? ?Clinical Impression ?  ?Pt currently min guard to supervision for selfcare tasks sit to stand, grooming tasks at the sink in standing, and toilet transfers and toileting without use of an assistive device.  Feel he will benefit from acute care OT to progress to modified independent level for these tasks to return home alone.  ?   ? ?Recommendations for follow up therapy are one component of a multi-disciplinary discharge planning process, led by the attending physician.  Recommendations may be updated based on patient status, additional functional criteria and insurance authorization.  ? ?Follow Up Recommendations ? No OT follow up  ?  ?Assistance Recommended at Discharge PRN  ?   ?Functional Status Assessment ? Patient has had a recent decline in their functional status and demonstrates the ability to make significant improvements in function in a reasonable and predictable amount of time.  ?Equipment Recommendations ? None recommended by OT  ?  ?   ?Precautions / Restrictions Precautions ?Precautions: Fall ?Restrictions ?Weight Bearing Restrictions: No  ? ?  ? ?Mobility Bed Mobility ?Overal bed mobility: Needs Assistance ?Bed Mobility: Supine to Sit, Sit to Supine ?  ?  ?Supine to sit: Modified independent (Device/Increase time) ?Sit to supine: Modified independent (Device/Increase time) ?  ?  ?  ? ?Transfers ?Overall transfer level: Needs assistance ?Equipment used: None ?Transfers: Sit to/from Stand ?Sit to Stand: Supervision ?  ?  ?  ?  ?  ?General transfer comment: No  assistive device for transfers to the toilet and sink with min guard assist. ?  ? ?  ?Balance Overall balance assessment: Needs assistance, History of Falls ?Sitting-balance support: No upper extremity supported ?Sitting balance-Leahy Scale: Good ?  ?  ?Standing balance support: No upper extremity supported ?Standing balance-Leahy Scale: Fair ?  ?  ?  ?  ?  ?  ?  ?  ?  ?  ?  ?  ?   ? ?ADL either performed or assessed with clinical judgement  ? ?ADL Overall ADL's : Needs assistance/impaired ?Eating/Feeding: Independent ?  ?Grooming: Wash/dry hands;Wash/dry face;Supervision/safety;Standing ?  ?Upper Body Bathing: Supervision/ safety;Standing ?  ?Lower Body Bathing: Sit to/from stand;Min guard ?  ?Upper Body Dressing : Moderate assistance;Standing ?Upper Body Dressing Details (indicate cue type and reason): hospital gown ?  ?  ?Toilet Transfer: Solicitor ?Toilet Transfer Details (indicate cue type and reason): no assistive device ?Toileting- Architect and Hygiene: Min guard;Sit to/from stand ?  ?  ?  ?Functional mobility during ADLs: Min guard (no device) ?General ADL Comments: Pt with increased abodminal pain but no meds requested.  He was able to ambulate to the toilet X2 with min guard and stand for completing of grooming tasks in standing as well as bathing UB and peri area at close supervision level with HR remaining at around 87 BPM and oxygen at 95% on room air.  ? ? ? ?Vision Baseline Vision/History: 0 No visual deficits ?Patient Visual Report: No change from baseline ?Vision Assessment?: No apparent visual deficits  ?   ?Perception Perception ?Perception: Within Functional Limits ?  ?  Praxis Praxis ?Praxis: Intact ?  ? ?Pertinent Vitals/Pain Pain Assessment ?Pain Assessment: Faces ?Pain Location: abdomen ?Pain Descriptors / Indicators: Discomfort ?Pain Intervention(s): Limited activity within patient's tolerance, Premedicated before session  ? ? ? ?Hand Dominance Right ?   ?Extremity/Trunk Assessment Upper Extremity Assessment ?Upper Extremity Assessment: Generalized weakness ?  ?Lower Extremity Assessment ?Lower Extremity Assessment: Defer to PT evaluation ?  ?Cervical / Trunk Assessment ?Cervical / Trunk Assessment: Normal ?  ?Communication Communication ?Communication: No difficulties ?  ?Cognition Arousal/Alertness: Awake/alert ?Behavior During Therapy: [facility] for tasks assessed/performed ?Overall Cognitive Status: No family/caregiver present to determine baseline cognitive functioning ?  ?  ?  ?  ?  ?  ?  ?  ?  ?  ?  ?  ?  ?  ?  ?  ?General Comments: Pt slow to respond at times.  Oriented to place, situation, and time except for day of the month. ?  ?  ?   ?   ?   ? ? ?Home Living Family/patient expects to be discharged to:: Private residence ?Living Arrangements: Alone ?Available Help at Discharge: Friend(s);Available PRN/intermittently ?Type of Home: House ?Home Access: Stairs to enter ?Entrance Stairs-Number of Steps: 5 ?Entrance Stairs-Rails: Left;Right ?Home Layout: One level ?  ?  ?Bathroom Shower/Tub: Walk-in shower ?  ?  ?  ?  ?Home Equipment: Shower seat - built in ?  ?  ?  ? ?  ?Prior Functioning/Environment Prior Level of Function : Independent/Modified Independent;Driving;History of Falls (last six months) ?  ?  ?  ?  ?  ?  ?Mobility Comments: Reports could ambulate in community; has had 2 falls 6 months due to loss of balance ?  ?  ? ?  ?  ?OT Problem List: Decreased strength;Impaired balance (sitting and/or standing) ?  ?   ?OT Treatment/Interventions: Self-care/ADL training;Balance training;Therapeutic activities;DME and/or AE instruction  ?  ?OT Goals(Current goals can be found in the care plan section) Acute Rehab OT Goals ?Patient Stated Goal: Pt requested to go to the bathroom and work on washing up. ?OT Goal Formulation: With patient ?Time For Goal Achievement: 11/26/21 ?Potential to Achieve Goals: Good  ?OT Frequency: Min 2X/week ?  ? ?   ?AM-PAC OT "6 Clicks"  Daily Activity     ?Outcome Measure Help from another person eating meals?: None ?Help from another person taking care of personal grooming?: None ?Help from another person toileting, which includes using toliet, bedpan, or urinal?: A Little ?Help from another person bathing (including washing, rinsing, drying)?: A Little ?Help from another person to put on and taking off regular upper body clothing?: None ?Help from another person to put on and taking off regular lower body clothing?: A Little ?6 Click Score: 21 ?  ?End of Session Equipment Utilized During Treatment: Gait belt ?Nurse Communication: Mobility status ? ?Activity Tolerance: Patient tolerated treatment well ?Patient left: in bed;with call bell/phone within reach;with family/visitor present ? ?OT Visit Diagnosis: Unsteadiness on feet (R26.81);Repeated falls (R29.6);Muscle weakness (generalized) (M62.81)  ?              ?Time: 9485-4627 ?OT Time Calculation (min): 41 min ?Charges:  OT General Charges ?$OT Visit: 1 Visit ?OT Evaluation ?$OT Eval Moderate Complexity: 1 Mod ?OT Treatments ?$Self Care/Home Management : 23-37 mins ? ?Anes Rigel OTR/L ?11/12/2021, 10:19 AM ?

## 2021-11-12 NOTE — Progress Notes (Signed)
IR was requested for liver lesion bx VS aspiration .  ? ?The procedure was tentatively scheduled for today, however, IR was not able to accommodate the procedure today due to scheduled outpatient procedures/urgent cases.  ? ?RN/MD notified, made npo at midnight.  ? ?The procedure is tentatively scheduled for tomorrow pending IR schedule.  ?Please call IR for questions and concerns.  ? ? ?Willette Brace PA-C ?11/12/2021 2:47 PM ? ? ? ?

## 2021-11-12 NOTE — Consult Note (Signed)
? ?Chief Complaint: ?Patient was seen in consultation today for liver lesion at the request of Hung,Patrick ? ?Referring Physician(s): ?Hung,Patrick ? ?Supervising Physician: Richarda OverlieHenn, Adam ? ?Patient Status: [facility] - In-pt ? ?History of Present Illness: ?[name] Ford is a 61 y.o. male with medical history significant for coronary artery disease, liver cirrhosis, depression, type diabetes mellitus, esophageal grade 2 varices, dyslipidemia, and polysubstance abuse including cocaine, tobacco and marijuana, who presented to the ER with acute onset of bloody vomitus.  Work-up in the ER suggestive of upper GI bleed was admitted to the hospital.  Imaging shows liver lesions and IR has been consulted for biopsy. ? ? ?Past Medical History:  ?Diagnosis Date  ? Anxiety   ? CAD (coronary artery disease)   ? Nonobstructive by cath [facility] in 2010 with 50% RCA and 50% ramus . Last adm 03/2009 with CP/mildly elevated troponin recommended for cath/echo but patient left AMA  ? Cirrhosis (HCC)   ? History of abnormal LFTs. Hx of biopsy per patient. Pt also states negative for Hep C in the past.  ? Depression   ? Diabetes mellitus   ? Esophageal varices (HCC)   ? Gallbladder disease   ? GERD (gastroesophageal reflux disease)   ? Hyperlipidemia   ? Myocardial infarction [facility](HCC)   ? 2013  ? Polysubstance abuse (HCC)   ? Hx of cocaine, tobacco, marijuana use  ? ? ?Past Surgical History:  ?Procedure Laterality Date  ? KNEE SURGERY    ? L knee ACL/meniscus repair 2002  ? ? ?Allergies: ?Patient has no known allergies. ? ?Medications: ?Prior to Admission medications   ?Medication Sig Start Date End Date Taking? Authorizing Provider  ?Alpha-Lipoic Acid 200 MG CAPS Take 1 capsule by mouth daily.   Yes [provider]  ?Coenzyme Q10 (CO Q 10) 100 MG CAPS Take 1 capsule by mouth daily.   Yes [provider]  ?glimepiride (AMARYL) 4 MG tablet Take 4 mg by mouth 2 (two) times daily. 02/18/21  Yes [provider]  ?hydrOXYzine (VISTARIL) 25 MG capsule Take 25 mg by mouth as needed for itching or anxiety.   Yes [provider]  ?Melatonin 1 MG CAPS Take 1 capsule by mouth at bedtime.   Yes [provider]  ?metFORMIN (GLUCOPHAGE) 1000 MG tablet Take 1,000 mg by mouth 2 (two) times daily with a meal.   Yes [provider]  ?MILK THISTLE PO Take 1 capsule by mouth daily.   Yes [provider]  ?Multiple Vitamin (MULTIVITAMIN) tablet Take 1 tablet by mouth daily.   Yes [provider]  ?pioglitazone (ACTOS) 45 MG tablet Take 45 mg by mouth daily. 02/18/21  Yes [provider]  ?Tetrahydrozoline HCl (VISINE OP) Place 1 drop into both eyes daily as needed (Dry eyes).   Yes [provider]  ?ENULOSE 10 GM/15ML SOLN TAKE 10 MLS BY MOUTH DAILY ?Patient not taking: Reported on 11/11/2021 05/21/21   Jenel Lucksunningham, Scott E, MD  ?propranolol (INDERAL) 10 MG tablet Propranolol 10 mg po tid with dosage titrated by heart rate (resting heart rate 55-65). ?Patient not taking: Reported on 11/11/2021 04/28/21   Jenel Lucksunningham, Scott E, MD  ?  ? ?Family History  ?Problem Relation Age of Onset  ? Breast cancer Mother   ? Lung cancer Mother   ?     smoking history  ? Stroke Father   ?     5080s  ? Hypertension Father   ? Alzheimer's disease  Father   ? Colon cancer Neg Hx   ? Esophageal cancer Neg Hx   ? Stomach cancer Neg Hx   ? Rectal cancer Neg Hx   ? ? ?Social History  ? ?Socioeconomic History  ? Marital status: Divorced  ?  Spouse name: Not on file  ? Number of children: Not on file  ? Years of education: Not on file  ? Highest education level: Not on file  ?Occupational History  ? Occupation: pizza delivery  ?Tobacco Use  ? Smoking status: Former  ?  Packs/day: 1.00  ?  Years: 35.00  ?  Pack years: 35.00  ?  Types: Cigarettes  ? Smokeless tobacco: Never  ?Substance and Sexual Activity  ? Alcohol use: No  ? Drug use: Not Currently  ?  Frequency: 1.0 times per week  ?  Types:  Cocaine, Marijuana  ?  Comment: Pt u/a to quantify amt of cocaine. 30 year h/o marijuana use, 3-4 times per week   ? Sexual activity: Not on file  ?Other Topics Concern  ? Not on file  ?Social History Narrative  ? Lives alone. Delivers pizza.  ? ?Social Determinants of Health  ? ?Financial Resource Strain: Not on file  ?Food Insecurity: Not on file  ?Transportation Needs: Not on file  ?Physical Activity: Not on file  ?Stress: Not on file  ?Social Connections: Not on file  ? ? ?Review of Systems: A 12 point ROS discussed and pertinent positives are indicated in the HPI above.  All other systems are negative. ? ?Review of Systems  ?Constitutional: Negative.   ?HENT: Negative.    ?Eyes: Negative.   ?Respiratory: Negative.    ?Cardiovascular: Negative.   ?Gastrointestinal:  Positive for nausea and vomiting.  ?Endocrine: Negative.   ?Genitourinary: Negative.   ?Musculoskeletal: Negative.   ?Allergic/Immunologic: Negative.   ?Neurological: Negative.   ?Hematological:  Bruises/bleeds easily.  ?Psychiatric/Behavioral: Negative.    ? ?Vital Signs: ?BP (!) 154/97 (BP Location: Left Arm)   Pulse 85   Temp (!) 100.5 ?F (38.1 ?C) (Axillary)   Resp 19   Ht 5\' 10"  (1.778 m)   Wt 139 lb 15.9 oz (63.5 kg)   SpO2 97%   BMI 20.09 kg/m?  ? ?Physical Exam ?Constitutional:   ?   General: He is not in acute distress. ?   Appearance: He is ill-appearing.  ?HENT:  ?   Mouth/Throat:  ?   Mouth: Mucous membranes are moist.  ?   Pharynx: Oropharynx is clear.  ?Eyes:  ?   Extraocular Movements: Extraocular movements intact.  ?   Conjunctiva/sclera: Conjunctivae normal.  ?Cardiovascular:  ?   Rate and Rhythm: Normal rate.  ?   Pulses: Normal pulses.  ?   Heart sounds: Normal heart sounds.  ?Pulmonary:  ?   Effort: Pulmonary effort is normal.  ?   Breath sounds: Normal breath sounds.  ?Abdominal:  ?   General: Abdomen is flat.  ?   Palpations: Abdomen is soft.  ?Skin: ?   General: Skin is warm and dry.  ?Neurological:  ?   General: No  focal deficit present.  ?   Mental Status: He is alert.  ?Psychiatric:     ?   Mood and Affect: Mood normal.     ?   Behavior: Behavior normal.  ? ? ?Imaging: ?MR ABDOMEN MRCP W WO CONTAST ? ?Result Date: 11/10/2021 ?CLINICAL DATA:  Epigastric pain, elevated LFTs, cirrhosis, liver masses EXAM: MRI ABDOMEN WITHOUT AND WITH CONTRAST (  INCLUDING MRCP) TECHNIQUE: Multiplanar multisequence MR imaging of the abdomen was performed both before and after the administration of intravenous contrast. Heavily T2-weighted images of the biliary and pancreatic ducts were obtained, and three-dimensional MRCP images were rendered by post processing. CONTRAST:  61mL GADAVIST GADOBUTROL 1 MMOL/ML IV SOLN COMPARISON:  Same day abdominal ultrasound, 11/10/2021 FINDINGS: Lower chest: No acute findings. Hepatobiliary: Extremely heterogeneous, nodular cirrhotic liver with extensive dysplastic/regenerative nodularity throughout. In the posterior liver dome and posterior right lobe of the liver, there are multiple closely adjacent, internal fluid signal, heterogeneously rim enhancing lesions, largest of hepatic segment VII measuring at least 9.4 x 4.3 cm (series 1004, image 41). Additional satellite lesions anteriorly measuring up to 1.4 x 1.4 cm (series 1003, image 46) and superiorly in the posterior liver dome measuring 1.7 x 1.4 cm (series 1003, image 34). Small gallstones in the dependent gallbladder. Severe gallbladder wall thickening and small volume pericholecystic fluid (series 2, image 16). No biliary ductal dilatation. Pancreas: No mass, inflammatory changes, or other parenchymal abnormality identified.No pancreatic ductal dilatation. Spleen:  Splenomegaly, maximum coronal span 16.6 cm. Adrenals/Urinary Tract: Normal adrenal glands. No renal masses or suspicious contrast enhancement identified. No evidence of hydronephrosis. Stomach/Bowel: Visualized portions within the abdomen are unremarkable. Vascular/Lymphatic: Enlarged portacaval  lymph nodes measuring up to 2.8 x 1.4 cm (series 1004, image 68). The portal and hepatic veins are patent. No abdominal aortic aneurysm demonstrated. Other:  Small volume ascites throughout the abdomen. Musculoskeletal: No su

## 2021-11-12 NOTE — Progress Notes (Signed)
Pt refusing bed and chair alarms at this time. Pt encouraged to maintain alarms due to high fall risk and educated on dangers of falls and fall prevention. Charge RN notified of situation.  ?

## 2021-11-12 NOTE — Progress Notes (Signed)
?                                  PROGRESS NOTE                                             ?                                                                                                                     ?                                         ? ? Patient Demographics:  ? ? [name] Ford, is a 61 y.o. male, DOB - [birth year], AOZ:[id number] ? ?Outpatient Primary MD for the patient is [name], [name], MD    LOS - 2  Admit date - 11/10/2021   ? ?Chief Complaint  ?Patient presents with  ? Hematemesis  ?    ? ?Brief Narrative (HPI from H&P)    61 y.o. Caucasian male with medical history significant for coronary artery disease, liver cirrhosis, depression, type diabetes mellitus, esophageal grade 2 varices, dyslipidemia, and polysubstance abuse including cocaine, tobacco and marijuana, who presented to the ER with acute onset of bloody vomitus.  Work-up in the ER suggestive of upper GI bleed was admitted to the hospital. ? ? Subjective:  ? ?Patient lying in bed in no discomfort, has chronic generalized body aches, no specific chest or abdominal pain, no shortness of breath. ? ? Assessment  & Plan :  ? ? ?Acute upper GI bleeding in a patient with known history of cirrhosis with varices in the past who is noncompliant with his propanolol.  At this time he has received 2 units of packed RBC transfusion at the time of admission due to ongoing acute GI bleed, he is on IV PPI and octreotide drip along with Rocephin for SBP prophylaxis.  Continue propanolol, GI on board and he is undergoing EGD on 11/11/2021, continue to monitor CBC and transfuse as needed goal hemoglobin 7.5.  May also require some platelet transfusion, will continue to monitor CBC closely. ? ?Cirrhosis related thrombocytopenia worse with blood loss.  Monitor transfuse if it drops below 20 with ongoing bleed, MRCP noted will defer to GI.  He has minimal to no right upper quadrant pain tenderness or pain, do not think  he has hepatic abscess or acute cholecystitis but will defer GI related issues to GI team. ? ?Gallbladder wall thickening noted on MRCP and right upper quadrant ultrasound with few liver lesions.  Pain-free will with minimal to no tenderness on deep palpation in the right upper quadrant, discussed with GI they want biopsy of liver lesions for which IR has  been consulted, of note he is abdominal pain-free. ? ?History of cirrhosis with transaminitis.  GI on board doing work-up for acute hepatitis or liver injury, Management per GI. ? ?Polysubstance abuse (HCC)  - He is trying to quit smoking cigarettes and uses lozenges and gums.  He says he has not done any alcohol or cocaine in several years, counseled to quit smoking and marijuana use.    ? ?DM type II.  Hold oral hypoglycemics.  Sliding scale. ? ?Lab Results  ?Component Value Date  ? HGBA1C 5.6 11/10/2021  ? ?CBG (last 3)  ?Recent Labs  ?  11/11/21 ?1234 11/11/21 ?1639 11/11/21 ?1953  ?GLUCAP 209* 419* 277*  ? ? ?   ? ?Condition - Extremely Guarded ? ?Family Communication  :  none present ? ?Code Status :  Full ? ?Consults  :  GI, IR ? ?PUD Prophylaxis : PPI ? ? Procedures  :    ? ?MRCP - 1. Extremely heterogeneous, nodular cirrhotic liver with extensive dysplastic/regenerative nodularity throughout. 2. In the posterior liver dome and posterior right lobe of the liver, there are multiple closely adjacent, heterogeneously rim enhancing, internal fluid signal lesions, largest of which measuring at least 9.4 cm. Suspect hepatic abscesses in the setting of clinically suspected cholecystitis/cholangitis. These lesions are not characteristic in appearance for hepatocellular carcinoma, although malignancy is not strictly excluded. 3. Cholelithiasis with severe gallbladder wall thickening and small volume pericholecystic fluid. Although nonspecific in the setting of ascites, findings are suspicious for acute cholecystitis. 4. Enlarged portacaval lymph nodes, nonspecific.  5. Small volume ascites throughout the abdomen. 6. Splenomegaly ? ?RUQ [place] -  Severe gallbladder wall thickening is noted with mild cholelithiasis and sludge within gallbladder lumen concerning for cholecystitis. Some pericholecystic fluid is noted as well. Hepatic cirrhosis is again noted with the development of at least 3 solid masses within the liver, the largest measuring 5.6 cm in the right hepatic lobe. These are highly concerning for malignancy such as hepatocellular carcinoma or metastatic disease ? ?EGD ? ?   ? ?Disposition Plan  :   ? ?Status is: Inpatient ? ?DVT Prophylaxis  :   ? ?SCDs Start: 11/10/21 0403 ?  ? ?Lab Results  ?Component Value Date  ? PLT 59 (L) 11/12/2021  ? ? ?Diet :  ?Diet Order   ? ?       ?  Diet Carb Modified Fluid consistency: Thin; Room service appropriate? Yes  Diet effective now       ?  ? ?  ?  ? ?  ?  ? ?Inpatient Medications ? ?Scheduled Meds: ? insulin aspart  0-15 Units Subcutaneous TID WC  ? insulin aspart  0-5 Units Subcutaneous QHS  ? insulin glargine-yfgn  12 Units Subcutaneous QHS  ? lactulose  10 g Oral Daily  ? melatonin  3 mg Oral QHS  ? multivitamin with minerals  1 tablet Oral Daily  ? [START ON 11/13/2021] pantoprazole  40 mg Intravenous Q12H  ? propranolol  10 mg Oral TID  ? ?Continuous Infusions: ? cefTRIAXone (ROCEPHIN)  IV Stopped (11/10/21 1934)  ? octreotide  (SANDOSTATIN)    IV infusion 50 mcg/hr (11/12/21 0446)  ? pantoprazole 8 mg/hr (11/12/21 0656)  ? ?PRN Meds:.acetaminophen, hydrOXYzine, ondansetron **OR** ondansetron (ZOFRAN) IV, traMADol, traZODone ? ?Antibiotics  :   ? ?Anti-infectives (From admission, onward)  ? ? Start     Dose/Rate Route Frequency Ordered Stop  ? 11/10/21 0900  cefTRIAXone (ROCEPHIN) 2 g in sodium chloride 0.9 %  100 mL IVPB       ? 2 g ?200 mL/hr over 30 Minutes Intravenous Every 24 hours 11/10/21 0854    ? ?  ? ? ? Time Spent in minutes  30 ? ? ?Susa Raring M.D on 11/12/2021 at 8:15 AM ? ?To page go to [email or web address]  ? ?[facility] -  Office  [phone] ? ?See all Orders from today for further details ? ? ? Objective:  ? ?Vitals:  ? 11/11/21 1951 11/11/21 2321 11/12/21 0336 11/12/21 5009  ?BP: 125/63 119/63 125/66 (!) 154/97  ?Pulse: 76 68 68 85  ?Resp: 18 12 15 19   ?Temp: 98.9 ?F (37.2 ?C) 98.7 ?F (37.1 ?C) 98.8 ?F (37.1 ?C) (!) 100.5 ?F (38.1 ?C)  ?TempSrc: Oral Oral Oral Axillary  ?SpO2: 100% 98% 95% 97%  ?Weight:      ?Height:      ? ? ?Wt Readings from Last 3 Encounters:  ?11/11/21 63.5 kg  ?04/28/21 62.1 kg  ?04/08/21 62.1 kg  ? ? ? ?Intake/Output Summary (Last 24 hours) at 11/12/2021 0815 ?Last data filed at 11/12/2021 0813 ?Gross per 24 hour  ?Intake 1747.75 ml  ?Output 1400 ml  ?Net 347.75 ml  ? ? ? ?Physical Exam ? ?Awake Alert, No new F.N deficits, Normal affect ?[place]in.AT,PERRAL ?Supple Neck, No JVD,   ?Symmetrical Chest wall movement, Good air movement bilaterally, CTAB ?RRR,No Gallops, Rubs or new Murmurs,  ?+ve B.Sounds, Abd Soft, No tenderness,   ?No Cyanosis, Clubbing or edema  ? ?  ? ? Data Review:  ? ? ?CBC ?Recent Labs  ?Lab 11/10/21 ?0232 11/10/21 ?1355 11/10/21 ?1932 11/11/21 ?0336 11/11/21 ?1207 11/11/21 ?1822 11/12/21 ?0237  ?WBC 14.2*   < > 7.3 5.7 5.5 5.2 4.7  ?HGB 9.9*   < > 8.4* 8.0* 8.4* 7.8* 7.7*  ?HCT 30.6*   < > 25.9* 23.4* 24.9* 23.9* 23.5*  ?PLT 150   < > 55* 55* 53* 53* 59*  ?MCV 84.8   < > 86.9 84.2 84.1 85.7 86.1  ?MCH 27.4   < > 28.2 28.8 28.4 28.0 28.2  ?MCHC 32.4   < > 32.4 34.2 33.7 32.6 32.8  ?RDW 16.4*   < > 15.9* 16.0* 16.2* 16.3* 16.3*  ?LYMPHSABS 0.5*  --   --  0.4*  --   --  0.4*  ?MONOABS 1.2*  --   --  0.9  --   --  0.9  ?EOSABS 0.2  --   --  0.0  --   --  0.1  ?BASOSABS 0.1  --   --  0.0  --   --  0.0  ? < > = values in this interval not displayed.  ? ? ?Electrolytes ?Recent Labs  ?Lab 11/10/21 ?0232 11/10/21 ?1049 11/10/21 ?1154 11/11/21 ?0336 11/11/21 ?1202 11/11/21 ?1822 11/12/21 ?0237  ?NA 135  --   --  136  --   --  136  ?K 4.8  --   --  4.1  --   --  4.0  ?CL 105  --   --  105   --   --  106  ?CO2 22  --   --  25  --   --  25  ?GLUCOSE 241*  --   --  187*  --  393* 132*  ?BUN 39*  --   --  25*  --   --  18  ?CREATININE 0.89  --   --  0.84  --   --  0.71  ?CALCIUM 9.5  --   --  8.

## 2021-11-12 NOTE — Progress Notes (Signed)
Pt c/o mid sternal chest pain that has been occurring through tonight. States that he has intermittent sharp pains in his chest. Provider Rathore notified, see orders. Will continue to monitor , call bell within reach. ? 11/12/21 0336  ?Vitals  ?Temp 98.8 ?F (37.1 ?C)  ?Temp Source Oral  ?BP 125/66  ?MAP (mmHg) 83  ?BP Location Left Arm  ?BP Method Automatic  ?Patient Position (if appropriate) Lying  ?Pulse Rate 68  ?Pulse Rate Source Monitor  ?ECG Heart Rate 68  ?Resp 15  ?MEWS COLOR  ?MEWS Score Color Green  ?Oxygen Therapy  ?SpO2 95 %  ?MEWS Score  ?MEWS Temp 0  ?MEWS Systolic 0  ?MEWS Pulse 0  ?MEWS RR 0  ?MEWS LOC 0  ?MEWS Score 0  ? ? ?

## 2021-11-12 NOTE — Progress Notes (Signed)
ECG completed per order, provider Rathore notified .  ?

## 2021-11-12 NOTE — Plan of Care (Signed)

## 2021-11-13 ENCOUNTER — Inpatient Hospital Stay (HOSPITAL_COMMUNITY): Payer: PPO

## 2021-11-13 DIAGNOSIS — K922 Gastrointestinal hemorrhage, unspecified: Secondary | ICD-10-CM | POA: Diagnosis not present

## 2021-11-13 DIAGNOSIS — K8301 Primary sclerosing cholangitis: Secondary | ICD-10-CM | POA: Diagnosis not present

## 2021-11-13 DIAGNOSIS — R932 Abnormal findings on diagnostic imaging of liver and biliary tract: Secondary | ICD-10-CM

## 2021-11-13 DIAGNOSIS — I8500 Esophageal varices without bleeding: Secondary | ICD-10-CM | POA: Diagnosis not present

## 2021-11-13 DIAGNOSIS — K745 Biliary cirrhosis, unspecified: Secondary | ICD-10-CM

## 2021-11-13 LAB — CBC WITH DIFFERENTIAL/PLATELET
Abs Immature Granulocytes: 0.01 10*3/uL (ref 0.00–0.07)
Basophils Absolute: 0 10*3/uL (ref 0.0–0.1)
Basophils Relative: 0 %
Eosinophils Absolute: 0.1 10*3/uL (ref 0.0–0.5)
Eosinophils Relative: 2 %
HCT: 22.6 % — ABNORMAL LOW (ref 39.0–52.0)
Hemoglobin: 7.4 g/dL — ABNORMAL LOW (ref 13.0–17.0)
Immature Granulocytes: 0 %
Lymphocytes Relative: 10 %
Lymphs Abs: 0.5 10*3/uL — ABNORMAL LOW (ref 0.7–4.0)
MCH: 28.4 pg (ref 26.0–34.0)
MCHC: 32.7 g/dL (ref 30.0–36.0)
MCV: 86.6 fL (ref 80.0–100.0)
Monocytes Absolute: 1 10*3/uL (ref 0.1–1.0)
Monocytes Relative: 21 %
Neutro Abs: 3.1 10*3/uL (ref 1.7–7.7)
Neutrophils Relative %: 67 %
Platelets: 58 10*3/uL — ABNORMAL LOW (ref 150–400)
RBC: 2.61 MIL/uL — ABNORMAL LOW (ref 4.22–5.81)
RDW: 16.4 % — ABNORMAL HIGH (ref 11.5–15.5)
WBC: 4.6 10*3/uL (ref 4.0–10.5)
nRBC: 0 % (ref 0.0–0.2)

## 2021-11-13 LAB — CBC
HCT: 23.3 % — ABNORMAL LOW (ref 39.0–52.0)
Hemoglobin: 7.7 g/dL — ABNORMAL LOW (ref 13.0–17.0)
MCH: 28.4 pg (ref 26.0–34.0)
MCHC: 33 g/dL (ref 30.0–36.0)
MCV: 86 fL (ref 80.0–100.0)
Platelets: 58 10*3/uL — ABNORMAL LOW (ref 150–400)
RBC: 2.71 MIL/uL — ABNORMAL LOW (ref 4.22–5.81)
RDW: 16.7 % — ABNORMAL HIGH (ref 11.5–15.5)
WBC: 3.9 10*3/uL — ABNORMAL LOW (ref 4.0–10.5)
nRBC: 0 % (ref 0.0–0.2)

## 2021-11-13 LAB — GLUCOSE, CAPILLARY
Glucose-Capillary: 175 mg/dL — ABNORMAL HIGH (ref 70–99)
Glucose-Capillary: 173 mg/dL — ABNORMAL HIGH (ref 70–99)
Glucose-Capillary: 177 mg/dL — ABNORMAL HIGH (ref 70–99)
Glucose-Capillary: 207 mg/dL — ABNORMAL HIGH (ref 70–99)
Glucose-Capillary: 263 mg/dL — ABNORMAL HIGH (ref 70–99)

## 2021-11-13 LAB — COMPREHENSIVE METABOLIC PANEL
ALT: 314 U/L — ABNORMAL HIGH (ref 0–44)
AST: 124 U/L — ABNORMAL HIGH (ref 15–41)
Albumin: 2.4 g/dL — ABNORMAL LOW (ref 3.5–5.0)
Alkaline Phosphatase: 147 U/L — ABNORMAL HIGH (ref 38–126)
Anion gap: 5 (ref 5–15)
BUN: 15 mg/dL (ref 8–23)
CO2: 25 mmol/L (ref 22–32)
Calcium: 7.8 mg/dL — ABNORMAL LOW (ref 8.9–10.3)
Chloride: 107 mmol/L (ref 98–111)
Creatinine, Ser: 0.85 mg/dL (ref 0.61–1.24)
GFR, Estimated: >60 mL/min (ref >60)
Glucose, Bld: 200 mg/dL — ABNORMAL HIGH (ref 70–99)
Potassium: 3.6 mmol/L (ref 3.5–5.1)
Sodium: 137 mmol/L (ref 135–145)
Total Bilirubin: 0.6 mg/dL (ref 0.3–1.2)
Total Protein: 4.7 g/dL — ABNORMAL LOW (ref 6.5–8.1)

## 2021-11-13 LAB — BRAIN NATRIURETIC PEPTIDE: B Natriuretic Peptide: 190 pg/mL — ABNORMAL HIGH (ref 0.0–100.0)

## 2021-11-13 LAB — PROCALCITONIN: Procalcitonin: 1.17 ng/mL

## 2021-11-13 LAB — MAGNESIUM: Magnesium: 1.8 mg/dL (ref 1.7–2.4)

## 2021-11-13 MED ORDER — MIDAZOLAM HCL 2 MG/2ML IJ SOLN
INTRAMUSCULAR | Status: AC | PRN
  Administered 2021-11-13: 1 mg via INTRAVENOUS

## 2021-11-13 MED ORDER — LIDOCAINE HCL (PF) 1 % IJ SOLN
INTRAMUSCULAR | Status: AC
  Filled 2021-11-13: qty 30

## 2021-11-13 MED ORDER — FENTANYL CITRATE (PF) 100 MCG/2ML IJ SOLN
INTRAMUSCULAR | Status: AC | PRN
  Administered 2021-11-13: 50 ug via INTRAVENOUS

## 2021-11-13 MED ORDER — FENTANYL CITRATE (PF) 100 MCG/2ML IJ SOLN
INTRAMUSCULAR | Status: AC
  Filled 2021-11-13: qty 4

## 2021-11-13 MED ORDER — PANTOPRAZOLE SODIUM 40 MG PO TBEC
40.0000 mg | DELAYED_RELEASE_TABLET | Freq: Every day | ORAL | Status: DC
  Administered 2021-11-14: 40 mg via ORAL
  Filled 2021-11-13: qty 1

## 2021-11-13 MED ORDER — GELATIN ABSORBABLE 12-7 MM EX MISC
CUTANEOUS | Status: AC
  Filled 2021-11-13: qty 1

## 2021-11-13 MED ORDER — LIDOCAINE-EPINEPHRINE 1 %-1:100000 IJ SOLN
INTRAMUSCULAR | Status: AC
  Filled 2021-11-13: qty 1

## 2021-11-13 MED ORDER — MIDAZOLAM HCL 2 MG/2ML IJ SOLN
INTRAMUSCULAR | Status: AC
  Filled 2021-11-13: qty 4

## 2021-11-13 NOTE — Progress Notes (Addendum)
? ? ? ?[facility] Progress Note ? ?CC:  GI bleed, cirrhosis ? ?Subjective: He recently returned from IR s/p liver biopsy. NPO until noon then clear liquids for possible colonoscopy tomorrow. He's not sure he is ready to pursue a colonoscopy tomorrow. He is hungry and wants to eat. No BM since yesterday. No N/V or abdominal pain.  ? ?Objective:  ?Vital signs in last 24 hours: ?Temp:  [98 ?F (36.7 ?C)-98.9 ?F (37.2 ?C)] 98.7 ?F (37.1 ?C) (04/13 1041) ?Pulse Rate:  [60-72] 67 (04/13 1041) ?Resp:  [12-18] 13 (04/13 1041) ?BP: (93-136)/(58-73) 110/61 (04/13 1041) ?SpO2:  [96 %-100 %] 96 % (04/13 1041) ?Last BM Date : 11/12/21 ?General: Alert, well-developed in NAD ?Heart: RRR, systolic murmur ?Pulm: Breath sounds clear throughout ?Abdomen: Soft, protuberant. No ascites. Nontender. Umbilical hernia. + BS x 4 quads.  ?Extremities:  Without edema. ?Neurologic:  Alert and  oriented x 4. Grossly normal neurologically. ?Psych:  Alert and cooperative. Normal mood and affect. ? ?Intake/Output from previous day: ?04/12 0701 - 04/13 0700 ?In: 240 [P.O.:240] ?Out: 450 [Urine:450] ?Intake/Output this shift: ?Total I/O ?In: -  ?Out: 775 [Urine:775] ? ?Lab Results: ?Recent Labs  ?  11/12/21 ?0237 11/12/21 ?1009 11/13/21 ?7829  ?WBC 4.7 10.0 4.6  ?HGB 7.7* 8.8* 7.4*  ?HCT 23.5* 26.3* 22.6*  ?PLT 59* 72* 58*  ? ?BMET ?Recent Labs  ?  11/11/21 ?0336 11/11/21 ?1822 11/12/21 ?0237 11/13/21 ?5621  ?NA 136  --  136 137  ?K 4.1  --  4.0 3.6  ?CL 105  --  106 107  ?CO2 25  --  25 25  ?GLUCOSE 187* 393* 132* 200*  ?BUN 25*  --  18 15  ?CREATININE 0.84  --  0.71 0.85  ?CALCIUM 8.4*  --  7.8* 7.8*  ? ?LFT ?Recent Labs  ?  11/13/21 ?3086  ?PROT 4.7*  ?ALBUMIN 2.4*  ?AST 124*  ?ALT 314*  ?ALKPHOS 147*  ?BILITOT 0.6  ? ?PT/INR ?Recent Labs  ?  11/11/21 ?0336  ?LABPROT 15.8*  ?INR 1.3*  ? ?Hepatitis Panel ?No results for input(s): HEPBSAG, HCVAB, HEPAIGM, HEPBIGM in the last 72 hours. ? ?No results found. ? ?Assessment / Plan: ? ?52)  61 year old male with a history of primary sclerosing cirrhosis with esophageal varices and portal hypertensive gastropathy presented to the ED 11/10/2021 with evaded LFTs, hematemesis and black stools. LFTs drifting downward since admission. EGD 11/11/2021 identified grade 2 esophageal varices eradicated following band placement x 3 and portal hypertensive gastropathy.  No stigmata of recent esophageal bleeding was identified, suspect upper GI bleed likely due to esophageal varices.  No further hematemesis.  No further melena. No evidence of hepatic encephalopathy. ?-Pantoprazole 40 mg p.o. daily ?-Repeat EGD in 4 weeks for repeat esophageal varices banding ?-2 g low-sodium diet ?-CBC, BMP and hepatic panel in a.m. ?-Patient at increased risk for IBD and CRC due to hx of PSC. Colonoscopy tentatively scheduled tomorrow, however, the patient is not sure he is ready to pursue a colonoscopy at this time. To discuss further with Dr. Tomasa Rand.  ?-Clear liquids starting at noon, if patient does not proceed with a colonoscopy tomorrow he can go back on his prior diet ?  ?2) Hepatic abscesses versus malignancy  (in the setting of PSC cirrhosis) per abdominal imaging. RUQ sono 11/10/2021 identified severe gallbladder wall thickening with mild cholelithiasis and sludge within the gallbladder lumen possibly indicating cholecystitis, 3 solid masses in the right hepatic lobe, the largest mass measured at 5.6  cm concerning for hepatocellular carcinoma or metastatic disease.  Abdominal MRI/MRCP with contrast identified multiple closely adjacent heterogeneously rim-enhancing hepatic lesions, the largest measuring 9.4 cm concerning for hepatic abscesses. IR consulted, liver lesions were not amenable for aspiration, s/p liver mass biopsy earlier today.  ?-Await liver bx path report ?  ?3) Anemia, secondary to # 1. Hg 9.9 (baseline Hg 12.5 on 03/19/2021). Transfused 2 units of PRBCs -> Hg 9.8 -> 8.4 -> 7.7 -> today Hg 7.4. He passed a  brown soft stool this afternoon. Hemodynamically stable.  ?-Monitor H&H closely ?-Transfuse for hemoglobin less than 7 ?  ?4) Nonobstructive coronary artery disease ?  ?5) Diabetes mellitus type 2 ?  ?6) History Polysubstance abuse (cocaine, tobacco and marijuana) ?  ? ?Principal Problem: ?  Upper GI bleeding ?Active Problems: ?  Hepatic cirrhosis (HCC) ?  Polysubstance abuse (HCC) ?  Type 2 diabetes mellitus without complications (HCC) ?  Esophageal varices determined by endoscopy (HCC) ?  Primary sclerosing cholangitis ? ? ? ? LOS: 3 days  ? ?Arnaldo Natal  11/13/2021, 11:14 AM ? ? ?I have taken a history, reviewed the chart and examined the patient. I performed a substantive portion of this encounter, including complete performance of at least one of the key components, in conjunction with the APP. I agree with the APP's note, impression and recommendations ? ?I spoke with the patient at length about the importance of undergoing a colonoscopy to exclude IBD and screen for [facility] given his increased risk.  The patient indicated understanding, but would prefer to do this as an outpatient.  As the indication for the colonoscopy is not urgent, I agreed with this plan. ? ?For the variceal bleed, the octreotide has been discontinued after 72 hours and he is not having any bloody stools.  Hgb dropped this morning compared to yesterday evening, but is overall stable of the past 48 hours.  BUN  continues to downtrend, now 15.  Low suspicion for any rebleeding. ? ?He underwent an FNA today to assess for abscess/infected fluid.   Will hopefully have preliminary results tomorrow. ?Would continue the ceftriaxone for now ? ?Clinically he appears much better today and he is wanting to go home.  Would prefer to have some idea as to whether there is an infection in his liver prior to discharge. ? ?Ronan Duecker E. Tomasa Rand, MD ?[facility] ? ?

## 2021-11-13 NOTE — Procedures (Signed)
Vascular and Interventional Radiology Procedure Note ? ?Patient: [name] Ford ?DOB: [birth year] ?Medical Record Number: [id number] ?Note Date/Time: 11/13/21 10:20 AM  ? ?Performing Physician: Roanna Banning, MD ?Assistant(s): None ? ?Diagnosis: Cirrhosis. Liver Mass ? ?Procedure: LIVER MASS BIOPSY ? ?Anesthesia: Conscious Sedation ?Complications: None ?Estimated Blood Loss: Minimal ?Specimens: Sent for Pathology ? ?Findings:  ?Successful Ultrasound-guided biopsy of posterior R hepatic lobe mass. ?A total of 3 samples were obtained. ?Hemostasis of the tract was achieved using Gelfoam Slurry Embolization. ? ?Plan: Bed rest for 2 hours. ? ?See detailed procedure note with images in PACS. ?The patient tolerated the procedure well without incident or complication and was returned to Floor Bed in stable condition.  ? ? ?Roanna Banning, MD ?[facility] ?[facility] ? ? ?Pager. [phone] ?Clinic. [phone]  ?

## 2021-11-13 NOTE — Care Management Important Message (Signed)
Important Message ? ?Patient Details  ?Name: [name] Ford ?MRN: [id number] ?Date of Birth: 06/15/1961 ? ? ?Medicare Important Message Given:  Yes ? ? ? ? ?Vinicius Brockman ?11/13/2021, 3:21 PM ?

## 2021-11-13 NOTE — Plan of Care (Signed)

## 2021-11-13 NOTE — Progress Notes (Signed)
?                                  PROGRESS NOTE                                             ?                                                                                                                     ?                                         ? ? Patient Demographics:  ? ? [name] Ford, is a 61 y.o. male, DOB - 1961/01/28, ZYS:[id number] ? ?Outpatient Primary MD for the patient is Kaleen Mask, MD    LOS - 3  Admit date - 11/10/2021   ? ?Chief Complaint  ?Patient presents with  ? Hematemesis  ?    ? ?Brief Narrative (HPI from H&P)    61 y.o. Caucasian male with medical history significant for coronary artery disease, liver cirrhosis, depression, type diabetes mellitus, esophageal grade 2 varices, dyslipidemia, and polysubstance abuse including cocaine, tobacco and marijuana, who presented to the ER with acute onset of bloody vomitus.  Work-up in the ER suggestive of upper GI bleed was admitted to the hospital. ? ? Subjective:  ? ?Patient in bed, appears comfortable, denies any headache, no fever, no chest pain or pressure, no shortness of breath , no abdominal pain. No new focal weakness. ? ? Assessment  & Plan :  ? ? ?Acute upper GI bleeding in a patient with known history of cirrhosis with varices in the past who is noncompliant with his propanolol.  At this time he has received 2 units of packed RBC transfusion at the time of admission due to ongoing acute GI bleed, he was on IV PPI and octreotide drip along with Rocephin for SBP prophylaxis.  Continue propanolol, he was seen by GI underwent EGD on 11/11/2021 with prominent varices which were clipped, no signs of ongoing bleeding, H&H remained stable, continue to monitor CBC and transfuse as needed goal hemoglobin 7.5.,  Closely monitor platelets as well. ? ?Cirrhosis related thrombocytopenia worse with blood loss.  Monitor platelet count. ? ?Gallbladder wall thickening noted on MRCP and right upper quadrant  ultrasound with few liver lesions.  Pain-free will with minimal to no tenderness on deep palpation in the right upper quadrant, discussed with GI they want biopsy of liver lesions for which IR has been consulted, of note he is abdominal pain-free.  Defer management of GI issues to GI team.  We will request GI team to please proceed with placing appropriate GI related orders as needed. ? ?  History of cirrhosis with transaminitis.  GI on board doing work-up for acute hepatitis or liver injury, Management per GI. ? ?Polysubstance abuse (HCC)  - He is trying to quit smoking cigarettes and uses lozenges and gums.  He says he has not done any alcohol or cocaine in several years, counseled to quit smoking and marijuana use.    ? ?DM type II.  Hold oral hypoglycemics.  Sliding scale and low-dose Lantus.  Monitor while he is n.p.o.  Currently oral status is dicey due to upcoming procedures and cancellation. ? ?Lab Results  ?Component Value Date  ? HGBA1C 5.6 11/10/2021  ? ?CBG (last 3)  ?Recent Labs  ?  11/12/21 ?1950 11/12/21 ?2332 11/13/21 ?0750  ?GLUCAP 230* 222* 175*  ? ? ?   ? ?Condition - Extremely Guarded ? ?Family Communication  :  none present ? ?Code Status :  Full ? ?Consults  :  GI, IR ? ?PUD Prophylaxis : PPI ? ? Procedures  :    ? ?MRCP - 1. Extremely heterogeneous, nodular cirrhotic liver with extensive dysplastic/regenerative nodularity throughout. 2. In the posterior liver dome and posterior right lobe of the liver, there are multiple closely adjacent, heterogeneously rim enhancing, internal fluid signal lesions, largest of which measuring at least 9.4 cm. Suspect hepatic abscesses in the setting of clinically suspected cholecystitis/cholangitis. These lesions are not characteristic in appearance for hepatocellular carcinoma, although malignancy is not strictly excluded. 3. Cholelithiasis with severe gallbladder wall thickening and small volume pericholecystic fluid. Although nonspecific in the setting of  ascites, findings are suspicious for acute cholecystitis. 4. Enlarged portacaval lymph nodes, nonspecific. 5. Small volume ascites throughout the abdomen. 6. Splenomegaly ? ?RUQ [place] -  Severe gallbladder wall thickening is noted with mild cholelithiasis and sludge within gallbladder lumen concerning for cholecystitis. Some pericholecystic fluid is noted as well. Hepatic cirrhosis is again noted with the development of at least 3 solid masses within the liver, the largest measuring 5.6 cm in the right hepatic lobe. These are highly concerning for malignancy such as hepatocellular carcinoma or metastatic disease ? ?EGD ? ?   ? ?Disposition Plan  :   ? ?Status is: Inpatient ? ?DVT Prophylaxis  :   ? ?SCDs Start: 11/10/21 0403 ?  ? ?Lab Results  ?Component Value Date  ? PLT 58 (L) 11/13/2021  ? ? ?Diet :  ?Diet Order   ? ?       ?  Diet NPO time specified Except for: Sips with Meds  Diet effective midnight       ?  ? ?  ?  ? ?  ?  ? ?Inpatient Medications ? ?Scheduled Meds: ? insulin aspart  0-15 Units Subcutaneous TID WC  ? insulin aspart  0-5 Units Subcutaneous QHS  ? insulin glargine-yfgn  12 Units Subcutaneous QHS  ? lactulose  10 g Oral Daily  ? melatonin  3 mg Oral QHS  ? multivitamin with minerals  1 tablet Oral Daily  ? pantoprazole  40 mg Intravenous Q12H  ? propranolol  10 mg Oral TID  ? ?Continuous Infusions: ? cefTRIAXone (ROCEPHIN)  IV 2 g (11/12/21 0905)  ? octreotide  (SANDOSTATIN)    IV infusion 50 mcg/hr (11/13/21 0436)  ? ?PRN Meds:.acetaminophen, hydrOXYzine, ondansetron **OR** ondansetron (ZOFRAN) IV, traMADol, traZODone ? ?Antibiotics  :   ? ?Anti-infectives (From admission, onward)  ? ? Start     Dose/Rate Route Frequency Ordered Stop  ? 11/10/21 0900  cefTRIAXone (ROCEPHIN) 2 g in sodium  chloride 0.9 % 100 mL IVPB       ? 2 g ?200 mL/hr over 30 Minutes Intravenous Every 24 hours 11/10/21 0854    ? ?  ? ? ? Time Spent in minutes  30 ? ? ?Susa RaringPrashant Gaberiel Youngblood M.D on 11/13/2021 at 8:21 AM ? ?To page go to  [email or web address]  ? ?[facility] -  Office  [phone] ? ?See all Orders from today for further details ? ? ? Objective:  ? ?Vitals:  ? 11/12/21 1949 11/12/21 2302 11/13/21 0337 11/13/21 0751  ?BP: 102/63 103/60 (!) 93/58 125/66  ?Pulse: 66 68 60 69  ?Resp: 17 14 14 18   ?Temp: 98.8 ?F (37.1 ?C) 98.7 ?F (37.1 ?C) 98.6 ?F (37 ?C) 98 ?F (36.7 ?C)  ?TempSrc: Oral Oral Oral Oral  ?SpO2: 100% 98% 97% 97%  ?Weight:      ?Height:      ? ? ?Wt Readings from Last 3 Encounters:  ?11/11/21 63.5 kg  ?04/28/21 62.1 kg  ?04/08/21 62.1 kg  ? ? ? ?Intake/Output Summary (Last 24 hours) at 11/13/2021 0821 ?Last data filed at 11/13/2021 0751 ?Gross per 24 hour  ?Intake 240 ml  ?Output 775 ml  ?Net -535 ml  ? ? ? ?Physical Exam ? ?Awake Alert, No new F.N deficits, Normal affect ?[employer].AT,PERRAL ?Supple Neck, No JVD,   ?Symmetrical Chest wall movement, Good air movement bilaterally, CTAB ?RRR,No Gallops, Rubs or new Murmurs,  ?+ve B.Sounds, Abd Soft, No tenderness,   ?No Cyanosis, Clubbing or edema  ? ? ? Data Review:  ? ? ?CBC ?Recent Labs  ?Lab 11/10/21 ?0232 11/10/21 ?1355 11/11/21 ?0336 11/11/21 ?1207 11/11/21 ?1822 11/12/21 ?0237 11/12/21 ?1009 11/13/21 ?[id number]  ?WBC 14.2*   < > 5.7 5.5 5.2 4.7 10.0 4.6  ?HGB 9.9*   < > 8.0* 8.4* 7.8* 7.7* 8.8* 7.4*  ?HCT 30.6*   < > 23.4* 24.9* 23.9* 23.5* 26.3* 22.6*  ?PLT 150   < > 55* 53* 53* 59* 72* 58*  ?MCV 84.8   < > 84.2 84.1 85.7 86.1 84.6 86.6  ?MCH 27.4   < > 28.8 28.4 28.0 28.2 28.3 28.4  ?MCHC 32.4   < > 34.2 33.7 32.6 32.8 33.5 32.7  ?RDW 16.4*   < > 16.0* 16.2* 16.3* 16.3* 16.4* 16.4*  ?LYMPHSABS 0.5*  --  0.4*  --   --  0.4*  --  0.5*  ?MONOABS 1.2*  --  0.9  --   --  0.9  --  1.0  ?EOSABS 0.2  --  0.0  --   --  0.1  --  0.1  ?BASOSABS 0.1  --  0.0  --   --  0.0  --  0.0  ? < > = values in this interval not displayed.  ? ? ?Electrolytes ?Recent Labs  ?Lab 11/10/21 ?0232 11/10/21 ?1049 11/10/21 ?1154 11/11/21 ?0336 11/11/21 ?1202 11/11/21 ?1822 11/12/21 ?0237 11/13/21 ?[id number]  ?NA 135   --   --  136  --   --  136 137  ?K 4.8  --   --  4.1  --   --  4.0 3.6  ?CL 105  --   --  105  --   --  106 107  ?CO2 22  --   --  25  --   --  25 25  ?GLUCOSE 241*  --   --  187*  --  393* 132* 200*  ?BUN 39*  -

## 2021-11-14 ENCOUNTER — Other Ambulatory Visit (HOSPITAL_COMMUNITY): Payer: Self-pay

## 2021-11-14 DIAGNOSIS — K922 Gastrointestinal hemorrhage, unspecified: Secondary | ICD-10-CM | POA: Diagnosis not present

## 2021-11-14 LAB — COMPREHENSIVE METABOLIC PANEL
ALT: 225 U/L — ABNORMAL HIGH (ref 0–44)
AST: 82 U/L — ABNORMAL HIGH (ref 15–41)
Albumin: 2.2 g/dL — ABNORMAL LOW (ref 3.5–5.0)
Alkaline Phosphatase: 175 U/L — ABNORMAL HIGH (ref 38–126)
Anion gap: 7 (ref 5–15)
BUN: 17 mg/dL (ref 8–23)
CO2: 23 mmol/L (ref 22–32)
Calcium: 7.5 mg/dL — ABNORMAL LOW (ref 8.9–10.3)
Chloride: 102 mmol/L (ref 98–111)
Creatinine, Ser: 0.85 mg/dL (ref 0.61–1.24)
GFR, Estimated: >60 mL/min (ref >60)
Glucose, Bld: 164 mg/dL — ABNORMAL HIGH (ref 70–99)
Potassium: 3.8 mmol/L (ref 3.5–5.1)
Sodium: 132 mmol/L — ABNORMAL LOW (ref 135–145)
Total Bilirubin: 0.5 mg/dL (ref 0.3–1.2)
Total Protein: 4.6 g/dL — ABNORMAL LOW (ref 6.5–8.1)

## 2021-11-14 LAB — TYPE AND SCREEN
ABO/RH(D): A POS
Antibody Screen: NEGATIVE
Unit division: 0
Unit division: 0
Unit division: 0
Unit division: 0
Unit division: 0
Unit division: 0

## 2021-11-14 LAB — BPAM RBC
Blood Product Expiration Date: 202304222359
Blood Product Expiration Date: 202304222359
Blood Product Expiration Date: 202304222359
Blood Product Expiration Date: 202304272359
Blood Product Expiration Date: 202305062359
Blood Product Expiration Date: 202305062359
ISSUE DATE / TIME: 202304090545
ISSUE DATE / TIME: 202304090603
ISSUE DATE / TIME: 202304100345
ISSUE DATE / TIME: 202304100345
Unit Type and Rh: 6200
Unit Type and Rh: 6200
Unit Type and Rh: 6200
Unit Type and Rh: 6200
Unit Type and Rh: 6200
Unit Type and Rh: 6200

## 2021-11-14 LAB — BRAIN NATRIURETIC PEPTIDE: B Natriuretic Peptide: 96.2 pg/mL (ref 0.0–100.0)

## 2021-11-14 LAB — CBC WITH DIFFERENTIAL/PLATELET
Abs Immature Granulocytes: 0.03 10*3/uL (ref 0.00–0.07)
Basophils Absolute: 0 10*3/uL (ref 0.0–0.1)
Basophils Relative: 0 %
Eosinophils Absolute: 0.1 10*3/uL (ref 0.0–0.5)
Eosinophils Relative: 2 %
HCT: 23.5 % — ABNORMAL LOW (ref 39.0–52.0)
Hemoglobin: 7.5 g/dL — ABNORMAL LOW (ref 13.0–17.0)
Immature Granulocytes: 1 %
Lymphocytes Relative: 11 %
Lymphs Abs: 0.5 10*3/uL — ABNORMAL LOW (ref 0.7–4.0)
MCH: 27.7 pg (ref 26.0–34.0)
MCHC: 31.9 g/dL (ref 30.0–36.0)
MCV: 86.7 fL (ref 80.0–100.0)
Monocytes Absolute: 1 10*3/uL (ref 0.1–1.0)
Monocytes Relative: 25 %
Neutro Abs: 2.5 10*3/uL (ref 1.7–7.7)
Neutrophils Relative %: 61 %
Platelets: 62 10*3/uL — ABNORMAL LOW (ref 150–400)
RBC: 2.71 MIL/uL — ABNORMAL LOW (ref 4.22–5.81)
RDW: 16.4 % — ABNORMAL HIGH (ref 11.5–15.5)
WBC: 4.1 10*3/uL (ref 4.0–10.5)
nRBC: 0 % (ref 0.0–0.2)

## 2021-11-14 LAB — GLUCOSE, CAPILLARY: Glucose-Capillary: 143 mg/dL — ABNORMAL HIGH (ref 70–99)

## 2021-11-14 LAB — MAGNESIUM: Magnesium: 1.8 mg/dL (ref 1.7–2.4)

## 2021-11-14 LAB — SURGICAL PATHOLOGY

## 2021-11-14 SURGERY — COLONOSCOPY WITH PROPOFOL
Anesthesia: Monitor Anesthesia Care

## 2021-11-14 MED ORDER — LACTULOSE ENCEPHALOPATHY 10 GM/15ML PO SOLN
10.0000 g | Freq: Every day | ORAL | 0 refills | Status: DC
  Filled 2021-11-14: qty 240, 16d supply, fill #0

## 2021-11-14 MED ORDER — PANTOPRAZOLE SODIUM 40 MG PO TBEC
40.0000 mg | DELAYED_RELEASE_TABLET | Freq: Every day | ORAL | 0 refills | Status: DC
  Filled 2021-11-14: qty 30, 30d supply, fill #0

## 2021-11-14 MED ORDER — PROPRANOLOL HCL 10 MG PO TABS
ORAL_TABLET | ORAL | 3 refills | Status: DC
  Filled 2021-11-14: qty 90, 30d supply, fill #0

## 2021-11-14 NOTE — Progress Notes (Signed)
Physical Therapy Treatment ?Patient Details ?Name: [name] Ford ?MRN: [id number] ?DOB: January 06, 1961 ?Today's Date: 11/14/2021 ? ? ?History of Present Illness Pt is 61 yo male admitted 11/10/21 with bloody vomitus suggestive of upper GI bleed.  Pt received 2 units PRBC, EGD on 4/11.  Pt with hx of CAD, liver cirrhosis, depression, DM, esophageal varices, dyslipidemia, and polysubstance abuse (EtOH and cocaine in past, tobacco current) ? ?  ?PT Comments  ? ? Pt was seen for last walk prior to his discharge as planned.  Pt is getting through the walk without instability unlike the last visit from PT.  Note that he is maintaining HR around 75, has no desaturation and is no longer laterally losing his balance.  Continue to recommend no PT after this but will be in touch with his PCP to see if any functional changes occur.  If DC is not done, follow POC as outlined by acute PT.    ?Recommendations for follow up therapy are one component of a multi-disciplinary discharge planning process, led by the attending physician.  Recommendations may be updated based on patient status, additional functional criteria and insurance authorization. ? ?Follow Up Recommendations ? No PT follow up ?  ?  ?Assistance Recommended at Discharge None  ?Patient can return home with the following A little help with walking and/or transfers;A little help with bathing/dressing/bathroom;Assistance with cooking/housework;Assist for transportation;Help with stairs or ramp for entrance ?  ?Equipment Recommendations ? None recommended by PT  ?  ?Recommendations for Other Services   ? ? ?  ?Precautions / Restrictions Precautions ?Precautions: Fall ?Precaution Comments: GI bleed ?Restrictions ?Weight Bearing Restrictions: No  ?  ? ?Mobility ? Bed Mobility ?Overal bed mobility: Modified Independent ?  ?  ?  ?  ?  ?  ?  ?  ? ?Transfers ?Overall transfer level: Needs assistance ?Equipment used: None ?Transfers: Sit to/from Stand ?Sit to Stand: Supervision ?  ?  ?   ?  ?  ?General transfer comment: supervised to help with telemetry lines ?  ? ?Ambulation/Gait ?Ambulation/Gait assistance: Supervision ?Gait Distance (Feet): 350 Feet ?Assistive device: None ?Gait Pattern/deviations: Step-through pattern, Wide base of support ?Gait velocity: decreased ?Gait velocity interpretation: <1.31 ft/sec, indicative of household ambulator ?Pre-gait activities: standing balance ck ?General Gait Details: no lateral LOB observed ? ? ?Stairs ?  ?  ?  ?  ?  ? ? ?Wheelchair Mobility ?  ? ?Modified Rankin (Stroke Patients Only) ?  ? ? ?  ?Balance Overall balance assessment: Needs assistance, History of Falls ?Sitting-balance support: Feet supported ?Sitting balance-Leahy Scale: Good ?  ?  ?Standing balance support: No upper extremity supported ?Standing balance-Leahy Scale: Fair ?  ?  ?  ?  ?  ?  ?  ?  ?  ?  ?  ?  ?  ? ?  ?Cognition Arousal/Alertness: Awake/alert ?Behavior During Therapy: [facility] for tasks assessed/performed ?Overall Cognitive Status: Within Functional Limits for tasks assessed ?  ?  ?  ?  ?  ?  ?  ?  ?  ?  ?  ?  ?  ?  ?  ?  ?General Comments: following instructions for his walk and mobility ?  ?  ? ?  ?Exercises   ? ?  ?General Comments General comments (skin integrity, edema, etc.): pt was seen for progressing his mobility and noted fewer reasons to be concerned including sats and HR stable, no lateral LOB but is mildly tired at the end ?  ?  ? ?  Pertinent Vitals/Pain Pain Assessment ?Pain Assessment: No/denies pain  ? ? ?Home Living   ?  ?  ?  ?  ?  ?  ?  ?  ?  ?   ?  ?Prior Function    ?  ?  ?   ? ?PT Goals (current goals can now be found in the care plan section) Acute Rehab PT Goals ?Patient Stated Goal: return home ?Progress towards PT goals: Progressing toward goals ? ?  ?Frequency ? ? ? Min 3X/week ? ? ? ?  ?PT Plan Current plan remains appropriate  ? ? ?Co-evaluation   ?  ?  ?  ?  ? ?  ?AM-PAC PT "6 Clicks" Mobility   ?Outcome Measure ? Help needed turning from your back to  your side while in a flat bed without using bedrails?: None ?Help needed moving from lying on your back to sitting on the side of a flat bed without using bedrails?: None ?Help needed moving to and from a bed to a chair (including a wheelchair)?: None ?Help needed standing up from a chair using your arms (e.g., wheelchair or bedside chair)?: None ?Help needed to walk in hospital room?: A Little ?Help needed climbing 3-5 steps with a railing? : A Little ?6 Click Score: 22 ? ?  ?End of Session Equipment Utilized During Treatment: Gait belt ?Activity Tolerance: Patient tolerated treatment well ?Patient left: in bed;with call bell/phone within reach;with bed alarm set ?Nurse Communication: Mobility status ?PT Visit Diagnosis: Unsteadiness on feet (R26.81);History of falling (Z91.81) ?  ? ? ?Time: 0932-6712 ?PT Time Calculation (min) (ACUTE ONLY): 18 min ? ?Charges:  $Gait Training: 8-22 mins     ?Ivar Drape ?11/14/2021, 1:14 PM ? ?Samul Dada, PT PhD ?Acute Rehab Dept. Number: [facility] [phone] and [facility] [phone] ? ? ?

## 2021-11-14 NOTE — Progress Notes (Signed)
Nursing dc note ? ?Patient alert and oriented , verbalized understanding of dc instructions. Ccmd notified of dc order. All belongings given to patient. Patient will be transferred to dc lounge. Nicole nt made aware ?

## 2021-11-14 NOTE — Discharge Instructions (Signed)
Follow with Primary MD [name] Mask, MD in 7 days follow on the liver biopsy results within a week. ? ?Get CBC, CMP, 2 view Chest X ray -  checked next visit within 1 week by Primary MD ? ?Activity: As tolerated with Full fall precautions use walker/cane & assistance as needed ? ?Disposition Home  ? ?Diet: Heart Healthy Low Carb ? ?Accuchecks 4 times/day, Once in AM empty stomach and then before each meal. ?Log in all results and show them to your Prim.MD in 3 days. ?If any glucose reading is under 80 or above 300 call your Prim MD immidiately. ?Follow Low glucose instructions for glucose under 80 as instructed. ? ? ?Special Instructions: If you have smoked or chewed Tobacco  in the last 2 yrs please stop smoking, stop any regular Alcohol  and or any Recreational drug use. ? ?On your next visit with your primary care physician please Get Medicines reviewed and adjusted. ? ?Please request your Prim.MD to go over all Hospital Tests and Procedure/Radiological results at the follow up, please get all Hospital records sent to your Prim MD by signing hospital release before you go home. ? ?If you experience worsening of your admission symptoms, develop shortness of breath, life threatening emergency, suicidal or homicidal thoughts you must seek medical attention immediately by calling 911 or calling your MD immediately  if symptoms less severe. ? ?You Must read complete instructions/literature along with all the possible adverse reactions/side effects for all the Medicines you take and that have been prescribed to you. Take any new Medicines after you have completely understood and accpet all the possible adverse reactions/side effects.  ? ?  ?

## 2021-11-14 NOTE — Discharge Summary (Signed)
?                                                                                ? ?[name] Ford [id number] DOB: Oct 21, 1960 DOA: 11/10/2021 ? ?PCP: [name] MaskElkins, [name], MD ? ?Admit date: 11/10/2021  Discharge date: 11/14/2021 ? ?Admitted From: Home   Disposition:  Home ? ? ?Recommendations for Outpatient Follow-up:  ? ?Follow up with PCP in 1-2 weeks ? ?PCP Please obtain BMP/CBC, 2 view CXR in 1week,  (see Discharge instructions)  ? ?PCP Please follow up on the following pending results: Kindly follow on pending liver biopsy results, check CBC CMP and magnesium level in 5 to 7 days.  Needs close outpatient GI follow-up. ? ? ?Home Health: None   ?Equipment/Devices: None  ?Consultations: GI, IR ?Discharge Condition: Stable    ?CODE STATUS: Full    ?Diet Recommendation: Heart Healthy Low Carb ?  ? ?Chief Complaint  ?Patient presents with  ? Hematemesis  ?  ? ?Brief history of present illness from the day of admission and additional interim summary   ? ?61 y.o. Caucasian male with medical history significant for coronary artery disease, liver cirrhosis, depression, type diabetes mellitus, esophageal grade 2 varices, dyslipidemia, and polysubstance abuse including cocaine, tobacco and marijuana, who presented to the ER with acute onset of bloody vomitus.  Work-up in the ER suggestive of upper GI bleed was admitted to the hospital. ? ?                                                               Hospital Course  ? ?Acute upper GI bleeding in a patient with known history of cirrhosis with varices in the past who is noncompliant with his propanolol.  He received 2 units of packed RBC transfusion, he was seen by GI underwent EGD confirming variceal bleeds, varices were banded, he was kept here initially on IV PPI and octreotide.  Now on oral PPI only.  No further signs of bleeding stable H&H and symptom-free.  Case discussed with  GI he will be discharged home with outpatient GI and PCP follow-up. ?  ?Cirrhosis related thrombocytopenia worse with blood loss.  Monitor platelet count. ? ?Gallbladder wall thickening noted on MRCP and right upper quadrant ultrasound with few liver lesions.  Pain-free will with minimal to no tenderness on deep palpation in the right upper quadrant, right upper quadrant abdominal pain hence clinically this was not consistent with abscess, he underwent biopsy by IR on 11/13/2021 of a solid liver lesion, pathology results are pending request PCP and GI physician to kindly monitor pending biopsy results closely, he will follow-up with GI in 1 to 2 weeks Case discussed with Dr. Tomasa Randunningham. ?  ?History of cirrhosis with transaminitis.  GI on board doing work-up for acute hepatitis or liver injury, Management per GI. ?  ?Polysubstance abuse (HCC)  - He is trying to quit smoking cigarettes and uses lozenges and gums.  He says he has  not done any alcohol or cocaine in several years, counseled to quit smoking and marijuana use.    ?  ?DM type II.  Continue home regimen.  Requested to check CBGs q. [facility] S ? ? ?Discharge diagnosis   ? ? ?Principal Problem: ?  Upper GI bleeding ?Active Problems: ?  Hepatic cirrhosis (HCC) ?  Type 2 diabetes mellitus without complications (HCC) ?  Polysubstance abuse (HCC) ?  Esophageal varices determined by endoscopy (HCC) ?  Primary sclerosing cholangitis ? ? ? ?Discharge instructions   ? ?Discharge Instructions   ? ? Discharge instructions   Complete by: As directed ?  ? Follow with Primary MD [name] Mask, MD in 7 days follow on the liver biopsy results within a week. ? ?Get CBC, CMP, 2 view Chest X ray -  checked next visit within 1 week by Primary MD ? ?Activity: As tolerated with Full fall precautions use walker/cane & assistance as needed ? ?Disposition Home  ? ?Diet: Heart Healthy Low Carb ? ?Accuchecks 4 times/day, Once in AM empty stomach and then before each meal. ?Log in all  results and show them to your Prim.MD in 3 days. ?If any glucose reading is under 80 or above 300 call your Prim MD immidiately. ?Follow Low glucose instructions for glucose under 80 as instructed. ? ? ?Special Instructions: If you have smoked or chewed Tobacco  in the last 2 yrs please stop smoking, stop any regular Alcohol  and or any Recreational drug use. ? ?On your next visit with your primary care physician please Get Medicines reviewed and adjusted. ? ?Please request your Prim.MD to go over all Hospital Tests and Procedure/Radiological results at the follow up, please get all Hospital records sent to your Prim MD by signing hospital release before you go home. ? ?If you experience worsening of your admission symptoms, develop shortness of breath, life threatening emergency, suicidal or homicidal thoughts you must seek medical attention immediately by calling 911 or calling your MD immediately  if symptoms less severe. ? ?You Must read complete instructions/literature along with all the possible adverse reactions/side effects for all the Medicines you take and that have been prescribed to you. Take any new Medicines after you have completely understood and accpet all the possible adverse reactions/side effects.  ? Increase activity slowly   Complete by: As directed ?  ? ?  ? ? ?Discharge Medications  ? ?Allergies as of 11/14/2021   ?No Known Allergies ?  ? ?  ?Medication List  ?  ? ?STOP taking these medications   ? ?Enulose 10 GM/15ML Soln ?Generic drug: lactulose (encephalopathy) ?Replaced by: lactulose 10 GM/15ML solution ?  ? ?  ? ?TAKE these medications   ? ?Alpha-Lipoic Acid 200 MG Caps ?Take 1 capsule by mouth daily. ?  ?Co Q 10 100 MG Caps ?Take 1 capsule by mouth daily. ?  ?glimepiride 4 MG tablet ?Commonly known as: AMARYL ?Take 4 mg by mouth 2 (two) times daily. ?  ?hydrOXYzine 25 MG capsule ?Commonly known as: VISTARIL ?Take 25 mg by mouth as needed for itching or anxiety. ?  ?lactulose 10 GM/15ML  solution ?Commonly known as: CHRONULAC ?Take 15 mLs (10 g total) by mouth daily. ?Start taking on: November 15, 2021 ?Replaces: Enulose 10 GM/15ML Soln ?  ?Melatonin 1 MG Caps ?Take 1 capsule by mouth at bedtime. ?  ?metFORMIN 1000 MG tablet ?Commonly known as: GLUCOPHAGE ?Take 1,000 mg by mouth 2 (two) times daily with a meal. ?  ?  MILK THISTLE PO ?Take 1 capsule by mouth daily. ?  ?multivitamin tablet ?Take 1 tablet by mouth daily. ?  ?pantoprazole 40 MG tablet ?Commonly known as: PROTONIX ?Take 1 tablet (40 mg total) by mouth daily. ?Start taking on: November 15, 2021 ?  ?pioglitazone 45 MG tablet ?Commonly known as: ACTOS ?Take 45 mg by mouth daily. ?  ?propranolol 10 MG tablet ?Commonly known as: INDERAL ?Propranolol 10 mg po tid with dosage titrated by heart rate (resting heart rate 55-65). ?  ?VISINE OP ?Place 1 drop into both eyes daily as needed (Dry eyes). ?  ? ?  ? ? ? Follow-up Information   ? ? [name] Mask, MD. Schedule an appointment as soon as possible for a visit in 1 week(s).   ?Specialty: Family Medicine ?Why: Follow-up on liver biopsy results ?Contact information: ?[address] ?[place] [address] ?[phone] ? ? ?  ?  ? ? Jenel Lucks, MD. Schedule an appointment as soon as possible for a visit in 1 week(s).   ?Specialty: Gastroenterology ?Why: Follow-up on liver biopsy results ?Contact information: ?[address] [place] ?[place] [address] ?[phone] ? ? ?  ?  ? ?  ?  ? ?  ? ? ?Major procedures and Radiology Reports - PLEASE review detailed and final reports thoroughly  -    ? ?  ?US BIOPSY (LIVER) ? ?Result Date: 11/13/2021 ?INDICATION: PSC cirrhosis.  Liver lesions on MR abdomen. EXAM: ULTRASOUND GUIDED LIVER LESION BIOPSY COMPARISON:  MRI abdomen and US Abdomen, 11/10/2021. US Abdomen, 04/15/2021. MEDICATIONS: None ANESTHESIA/SEDATION: Fentanyl 50 mcg IV; Versed 2 mg IV Total Moderate Sedation time:  25 minutes. The patient's level of consciousness and vital signs were  monitored continuously by radiology nursing throughout the procedure under my direct supervision. COMPLICATIONS: None immediate. PROCEDURE: Informed written consent was obtained from the patient and/or

## 2021-11-16 LAB — ANTI-SMOOTH MUSCLE ANTIBODY, IGG: F-Actin IgG: 5 Units (ref 0–19)

## 2021-11-16 LAB — CULTURE, BLOOD (ROUTINE X 2)
Culture: NO GROWTH
Culture: NO GROWTH
Special Requests: ADEQUATE

## 2021-11-18 ENCOUNTER — Other Ambulatory Visit: Payer: Self-pay

## 2021-11-18 ENCOUNTER — Telehealth: Payer: Self-pay | Admitting: Nurse Practitioner

## 2021-11-18 DIAGNOSIS — K922 Gastrointestinal hemorrhage, unspecified: Secondary | ICD-10-CM

## 2021-11-18 DIAGNOSIS — I8500 Esophageal varices without bleeding: Secondary | ICD-10-CM

## 2021-11-18 DIAGNOSIS — K745 Biliary cirrhosis, unspecified: Secondary | ICD-10-CM

## 2021-11-18 NOTE — Telephone Encounter (Signed)
Per Alcide Evener, CRNP's request, pt has been scheduled for hosp f/u on 12/11/21 @ 1050am. Appt reminder has been mailed. Called pt to inform about need to complete labs on 11/21/21 as documented by Alcide Evener, CRNP. LVM requesting returned call. Orders placed as requested by Alcide Evener, CRNP. Reminder has also been created to ensure pt has completed orders in a timely manner. ? ?

## 2021-11-18 NOTE — Telephone Encounter (Signed)
[name] Ford, patient was discharged from the hospital 11/14/2021. Pls send him to our lab to have a CBC, CMP and INR on  11/21/2021. Pls schedule him for a follow up appointment with Dr. Candis Schatz in 2 to 3 weeks. If he does not have an appointment availability within 2 to 3 weeks then please communicate with Dr. Candis Schatz to coordinate follow up. THX. ?

## 2021-11-19 NOTE — Telephone Encounter (Signed)
SECOND ATTEMPT: ° °LVM requesting returned call. °

## 2021-11-20 LAB — IGG: IgG (Immunoglobin G), Serum: 555 mg/dL — ABNORMAL LOW (ref 603–1613)

## 2021-11-20 LAB — AFP TUMOR MARKER: AFP, Serum, Tumor Marker: <1.8 ng/mL (ref 0.0–8.4)

## 2021-11-20 LAB — ANA: Anti Nuclear Antibody (ANA): NEGATIVE

## 2021-11-20 LAB — CERULOPLASMIN: Ceruloplasmin: 29.8 mg/dL (ref 16.0–31.0)

## 2021-11-20 LAB — MITOCHONDRIAL ANTIBODIES: Mitochondrial M2 Ab, IgG: <20 Units (ref 0.0–20.0)

## 2021-11-20 LAB — ANTI-SMOOTH MUSCLE ANTIBODY, IGG: F-Actin IgG: 4 Units (ref 0–19)

## 2021-11-20 NOTE — Telephone Encounter (Signed)
FINAL ATTEMPT: ? ?Called pt to inform about need for repeat labs tomorrow and about f/u appt scheduled with Dr. Tomasa Rand on 12/11/21. Confirmed he will stop by our lab tomorrow as requested. Also states he intends to attend f/u appt as scheduled.  ? ?While on the phone with pt, states he has been having swelling of his abd and feet. Denies any dyspnea, constipation, abd pain/firmness, rectal bleeding, N/V, or increase in weight or difficulties with clothes fitting too tight. Further added, "it just seems hard to tie my shoes or put on socks". Confirms he is drinking mostly water and tea. States he has not been monitoring his weight as closely since discharge, nor keeping a diary to monitor how much sodium he is consuming daily. However, did state he is trying to reduce sodium intake as much as he can. Confirmed he has a f/u appt with his PCP tomorrow. MAR reviewed with pt and all medications currently Rx'd and being taken has been reconciled. Provided with the following recommendations: ? ?Daily weights to better determine fluctuations in weight ?Diary to journal sodium intake. Education provided ?If not already, may want to begin monitoring blood pressure. Keep a journal of his daily readings ?Advised he continue to drink water to remain hydrated but try to eliminate tea as this can worsen kidney function ?Keep f/u appt with PCP as scheduled. Did not notice that we have access to PCP's records. Pt provided with fax # to ensure office notes are received for provider review ?Complete labs tomorrow as requested. Will notify with results/recommendations once received ?Keep f/u appt as scheduled with Dr. Tomasa Rand ?Return to ED if he develops worsening symptoms of dyspnea, abd pain, wt gain greater than 2#'s in 24 hr period, emesis with blood, rectal bleeding ? ?Routing this message to [facility], CRNP and Dr. Tomasa Rand for continuity of care and for additional recommendations. ?

## 2021-11-24 ENCOUNTER — Telehealth: Payer: Self-pay

## 2021-11-24 NOTE — Telephone Encounter (Signed)
SECOND ATTEMPT: ° °LVM requesting returned call. °

## 2021-11-24 NOTE — Telephone Encounter (Signed)
As previously documented (refer to encounter 4/18), pt was advised to have labs repeated 11/21/21. At the time of this entry, pt had not completed labs as requested. Called pt to provide him with reminder. LVM asking for returned call. ?

## 2021-11-25 NOTE — Telephone Encounter (Signed)
Routing this message to pt care team to determine if a conversation had occurred with PCP's office resulting in pt no longer needing to complete labs. ?

## 2021-11-25 NOTE — Telephone Encounter (Signed)
THIRD ATTEMPT: ? ?Called pt to provide reminder to complete labs. States he had seen his PCP and labs had been obtained during his OV. Further added that his PCP's office spoke with staff in our office and was advised labs were no longer required. Did not see any abstracted lab results nor documentation to support conversation. Advised I will route this message to Dr. Tomasa Rand and his staff to validate conversation. Advised I will call back with their response. ?

## 2021-11-25 NOTE — Telephone Encounter (Signed)
Called pt to advise, at this time, our providers nor staff have received any office notes or updated labs to reflect that he would no longer need to have labs obtained. Advised pt to have office and labs faxed to our office [phone]) for our staff and providers' review. Once received and reviewed, our providers' can better determine if labs are still required based on what the PCP has already obtained. Verbalized acceptance and understanding. ?

## 2021-11-26 NOTE — Telephone Encounter (Signed)
Lab results received from [place]. Placed on Carl Best, [place] desk for her review. Will await her response. ?

## 2021-11-26 NOTE — Telephone Encounter (Signed)
Labs from his PCP 11/21/2020: Hg 9.6 (up from Hg 7.5 on 11/14/2021). PLT 107. K+ 5.3. Alk phos 425 (175). T. Bili 0.6. AST 55 (82). ALT 86 (225). ? ?I spoke with Dr. Candis Schatz, he will review labs further and he will contact patient with his recommendations, refer to [facility].   ?

## 2021-11-26 NOTE — Telephone Encounter (Signed)
Referral, demos, copy of ins card, and records faxed to [facility] with confirmation received. ?

## 2021-11-27 ENCOUNTER — Other Ambulatory Visit: Payer: Self-pay

## 2021-11-27 ENCOUNTER — Encounter: Payer: Self-pay | Admitting: Gastroenterology

## 2021-11-27 DIAGNOSIS — K745 Biliary cirrhosis, unspecified: Secondary | ICD-10-CM

## 2021-11-27 DIAGNOSIS — R188 Other ascites: Secondary | ICD-10-CM

## 2021-12-01 ENCOUNTER — Ambulatory Visit (HOSPITAL_COMMUNITY)
Admission: RE | Admit: 2021-12-01 | Discharge: 2021-12-01 | Disposition: A | Discharge status: Home / Self Care | Payer: PPO | Source: Ambulatory Visit | Attending: Gastroenterology | Admitting: Gastroenterology

## 2021-12-01 DIAGNOSIS — R188 Other ascites: Secondary | ICD-10-CM | POA: Insufficient documentation

## 2021-12-01 DIAGNOSIS — K745 Biliary cirrhosis, unspecified: Secondary | ICD-10-CM | POA: Diagnosis not present

## 2021-12-01 HISTORY — PX: IR PARACENTESIS: IMG2679

## 2021-12-01 LAB — BODY FLUID CELL COUNT WITH DIFFERENTIAL
Eos, Fluid: 0 %
Lymphs, Fluid: 82 %
Monocyte-Macrophage-Serous Fluid: 10 % — ABNORMAL LOW (ref 50–90)
Neutrophil Count, Fluid: 8 % (ref 0–25)
Total Nucleated Cell Count, Fluid: 243 cu mm (ref 0–1000)

## 2021-12-01 LAB — PROTEIN, PLEURAL OR PERITONEAL FLUID: Total protein, fluid: <3 g/dL

## 2021-12-01 LAB — GRAM STAIN

## 2021-12-01 LAB — ALBUMIN, PLEURAL OR PERITONEAL FLUID: Albumin, Fluid: <1.5 g/dL

## 2021-12-01 MED ORDER — LIDOCAINE HCL 1 % IJ SOLN
INTRAMUSCULAR | Status: AC
  Filled 2021-12-01: qty 20

## 2021-12-01 MED ORDER — LIDOCAINE HCL (PF) 1 % IJ SOLN
INTRAMUSCULAR | Status: DC | PRN
Start: 2021-12-01 — End: 2021-12-02
  Administered 2021-12-01: 10 mL

## 2021-12-01 NOTE — Procedures (Signed)
PROCEDURE SUMMARY: ? ?Successful ultrasound guided paracentesis from the right lower abdomen.  ? ?Yielded 2L of hazy pale yellow fluid.  ?No immediate complications.  ?The patient tolerated the procedure well.  ? ?Specimen was  sent for labs. ? ?EBL < 1 mL ? ?If the patient eventually requires >/=2 paracenteses in a 30 day period, screening evaluation by the [facility] Interventional Radiology Portal Hypertension Clinic will be assessed. ? ? ?Willette Brace PA-C ?12/01/2021 9:42 AM ? ? ? ?

## 2021-12-02 LAB — PATHOLOGIST SMEAR REVIEW

## 2021-12-06 LAB — CULTURE, BODY FLUID W GRAM STAIN -BOTTLE: Culture: NO GROWTH

## 2021-12-11 ENCOUNTER — Other Ambulatory Visit (HOSPITAL_COMMUNITY): Payer: Self-pay

## 2021-12-11 ENCOUNTER — Encounter: Payer: Self-pay | Admitting: Gastroenterology

## 2021-12-11 ENCOUNTER — Other Ambulatory Visit (INDEPENDENT_AMBULATORY_CARE_PROVIDER_SITE_OTHER): Payer: PPO

## 2021-12-11 ENCOUNTER — Ambulatory Visit (INDEPENDENT_AMBULATORY_CARE_PROVIDER_SITE_OTHER): Payer: PPO | Admitting: Gastroenterology

## 2021-12-11 VITALS — BP 100/58 | HR 64 | Ht 70.0 in | Wt 138.0 lb

## 2021-12-11 DIAGNOSIS — I8511 Secondary esophageal varices with bleeding: Secondary | ICD-10-CM

## 2021-12-11 DIAGNOSIS — K7682 Hepatic encephalopathy: Secondary | ICD-10-CM | POA: Diagnosis not present

## 2021-12-11 DIAGNOSIS — R188 Other ascites: Secondary | ICD-10-CM | POA: Diagnosis not present

## 2021-12-11 DIAGNOSIS — K745 Biliary cirrhosis, unspecified: Secondary | ICD-10-CM

## 2021-12-11 LAB — COMPREHENSIVE METABOLIC PANEL
ALT: 36 U/L (ref 0–53)
AST: 42 U/L — ABNORMAL HIGH (ref 0–37)
Albumin: 3.8 g/dL (ref 3.5–5.2)
Alkaline Phosphatase: 268 U/L — ABNORMAL HIGH (ref 39–117)
BUN: 25 mg/dL — ABNORMAL HIGH (ref 6–23)
CO2: 31 mEq/L (ref 19–32)
Calcium: 9.2 mg/dL (ref 8.4–10.5)
Chloride: 99 mEq/L (ref 96–112)
Creatinine, Ser: 0.88 mg/dL (ref 0.40–1.50)
GFR: 91.81 mL/min (ref >60.00)
Glucose, Bld: 157 mg/dL — ABNORMAL HIGH (ref 70–99)
Potassium: 4.8 mEq/L (ref 3.5–5.1)
Sodium: 136 mEq/L (ref 135–145)
Total Bilirubin: 0.6 mg/dL (ref 0.2–1.2)
Total Protein: 6.7 g/dL (ref 6.0–8.3)

## 2021-12-11 LAB — CBC WITH DIFFERENTIAL/PLATELET
Basophils Absolute: 0 10*3/uL (ref 0.0–0.1)
Basophils Relative: 0.6 % (ref 0.0–3.0)
Eosinophils Absolute: 0.1 10*3/uL (ref 0.0–0.7)
Eosinophils Relative: 2.5 % (ref 0.0–5.0)
HCT: 29.7 % — ABNORMAL LOW (ref 39.0–52.0)
Hemoglobin: 9.6 g/dL — ABNORMAL LOW (ref 13.0–17.0)
Lymphocytes Relative: 14.7 % (ref 12.0–46.0)
Lymphs Abs: 0.4 10*3/uL — ABNORMAL LOW (ref 0.7–4.0)
MCHC: 32.5 g/dL (ref 30.0–36.0)
MCV: 77 fl — ABNORMAL LOW (ref 78.0–100.0)
Monocytes Absolute: 0.5 10*3/uL (ref 0.1–1.0)
Monocytes Relative: 17.2 % — ABNORMAL HIGH (ref 3.0–12.0)
Neutro Abs: 1.9 10*3/uL (ref 1.4–7.7)
Neutrophils Relative %: 65 % (ref 43.0–77.0)
Platelets: 88 10*3/uL — ABNORMAL LOW (ref 150.0–400.0)
RBC: 3.86 Mil/uL — ABNORMAL LOW (ref 4.22–5.81)
RDW: 16.1 % — ABNORMAL HIGH (ref 11.5–15.5)
WBC: 3 10*3/uL — ABNORMAL LOW (ref 4.0–10.5)

## 2021-12-11 MED ORDER — RIFAXIMIN 550 MG PO TABS: 550.0000 mg | ORAL_TABLET | Freq: Two times a day (BID) | ORAL | 0 refills | Status: DC

## 2021-12-11 NOTE — Progress Notes (Signed)
? ?HPI : [name] Ford is a very pleasant 61 year old male with biliary cirrhosis of unclear etiology who presents to our clinic today for follow-up after recent hospitalization for upper GI bleed attributed to esophageal varices (April 10-14).  He had initially presented to the hospital with symptoms of melena and hematemesis, as well as worsening fatigue and weakness.  He was found to have a hemoglobin of 9 down from 12 and a BUN 39. ? ?He underwent an upper endoscopy on April 11 and was found to have large esophageal varices, and no other suspicious lesions in the upper GI tract to explain hematemesis.  Two bands were placed and he had no further signs/symptoms of GI bleeding during his hospitalization.  He was started on propranolol during his hospitalization and he has been taking 2-3x/day.  He does feel tired, but he is not sure how much of that is secondary to the medications. ? ?An MRI of his liver was done during his hospitalization which was markedly abnormal, characterized by numerous lesions concerning for abscesses versus malignancy.  Tumor markers were normal.  Evaluation for infection was negative (negative blood cultures, normal procalcitonin).  He also did not have any clinical symptoms suggestive of hepatic abscess (no significant RUQ tenderness, no fevers/chills, no nausea/vomiting).  He underwent a liver biopsy and the path results showed evidence of infarction with superimposed abscess formation.  Additionally, the biopsies showed features consistent with primary biliary cirrhosis.  No evidence of malignancy was seen. ? ?Laboratory evaluation for causes of chronic liver disease was negative, including anti-mitochondrial antibodies, was negative.  ? ?I initially saw the patient in clinic in Sept 2022.  At that time, he had a diagnosis of cirrhosis but he was not sure of the underlying cause of the cirrhosis.  A subsequent review of his outside records indicates that he was diagnosed with PSC  around 2010.  ? ?Shortly after his hospitalization, the patient developed abdominal swelling and distention. He underwent a diagnostic/therapeutic paracentesis by IR as an outpatient and had 2 liters of fluid removed. Ascitic fluid was low protein/low albumin consistent with portal hypertension.   ?He was started on lasix and aldactone (40 mg/100 mg) following the paracentesis. ? ?Since the paracentesis on May 1st, he experienced marked improvement in his distention, but he has noted that the fluid is starting to reaccumulate.  He was not aware of the recommendation to follow a low-sodium diet.  He has been taking his diuretics daily as directed. ? ?At his initial clinic visit in Sept 2022, he was thought to have mild symptoms of hepatic encephalopathy.  He was prescribed lactulose, but he did not tolerate due to significant diarrhea (patient states he was unable to leave the house because of loose stools with urgency).  He continues to endorse symptoms of head fog, episodes of mild confusion and slowness of thought and speech.  He also reports significant difficulty sleeping at night, combined with profound daytime sleepiness. ? ?Because of the discordant diagnoses (previous diagnosis of PSC, biopsies suggestive of PBC, but AMA neg) as well as the patient's deteriortating clinical status (new variceal bleed, new ascites, new HE), the patient was referred to transplant hepatology for further management and hopefully to provide clarification on his diagnosis and whether treatment for PBC is warranted.  However, the patient states that he did not make an appointment with the hepatology for financial reasons.  He is having trouble paying bills and does not think he could afford additional doctor's bills.  He  also plainly states that he is not interested in a liver transplant surgery. ? ?Similarly, the patient has been resistant to undergoing a colonoscopy which was strongly recommended/urged during his hospitalization  due to his presumed diagnosis of PSC.  He has never undergone a colonoscopy before.  He has also been recommended to repeat upper endoscopy for further variceal banding, but is not wanting to proceed with this either. ? ?He saw his PCM at follow up on April 21st and had repeat labs drawn: ?Hgb 9.6, Plt 107 ?BMP 137/5.3/99/26/17/0.78 (prior to starting diuretics) ?AST 55, ALT 86, ALP 425, Tbili 0.6 ?INR 1 ? ?Past Medical History:  ?Diagnosis Date  ? Anxiety   ? CAD (coronary artery disease)   ? Nonobstructive by cath in [facility] reportedly in 2010 with 50% RCA and 50% ramus . Last adm 03/2009 with CP/mildly elevated troponin recommended for cath/echo but patient left AMA  ? Cirrhosis (HCC)   ? History of abnormal LFTs. Hx of biopsy per patient. Pt also states negative for Hep C in the past.  ? Depression   ? Diabetes mellitus   ? Esophageal varices (HCC)   ? Gallbladder disease   ? GERD (gastroesophageal reflux disease)   ? Hyperlipidemia   ? Myocardial infarction [facility])   ? 2013  ? Polysubstance abuse (HCC)   ? Hx of cocaine, tobacco, marijuana use  ? ? ? ?Past Surgical History:  ?Procedure Laterality Date  ? ESOPHAGEAL BANDING  11/11/2021  ? Procedure: ESOPHAGEAL BANDING;  Surgeon: Jenel Lucks, MD;  Location: [facility] ENDOSCOPY;  Service: Gastroenterology;;  ? ESOPHAGOGASTRODUODENOSCOPY (EGD) WITH PROPOFOL N/A 11/11/2021  ? Procedure: ESOPHAGOGASTRODUODENOSCOPY (EGD) WITH PROPOFOL;  Surgeon: Jenel Lucks, MD;  Location: [facility] ENDOSCOPY;  Service: Gastroenterology;  Laterality: N/A;  ? IR PARACENTESIS  12/01/2021  ? KNEE SURGERY    ? L knee ACL/meniscus repair 2002  ? ?Family History  ?Problem Relation Age of Onset  ? Breast cancer Mother   ? Lung cancer Mother   ?     smoking history  ? Stroke Father   ?     21s  ? Hypertension Father   ? Alzheimer's disease Father   ? Colon cancer Neg Hx   ? Esophageal cancer Neg Hx   ? Stomach cancer Neg Hx   ? Rectal cancer Neg Hx   ? Liver disease Neg Hx   ? Liver cancer Neg  Hx   ? ?Social History  ? ?Tobacco Use  ? Smoking status: Some Days  ?  Packs/day: 1.00  ?  Years: 35.00  ?  Pack years: 35.00  ?  Types: Cigarettes  ? Smokeless tobacco: Never  ?Substance Use Topics  ? Alcohol use: No  ? Drug use: Not Currently  ?  Frequency: 1.0 times per week  ?  Types: Cocaine, Marijuana  ?  Comment: Pt u/a to quantify amt of cocaine. 30 year h/o marijuana use, 3-4 times per week   ? ?Current Outpatient Medications  ?Medication Sig Dispense Refill  ? Alpha-Lipoic Acid 200 MG CAPS Take 1 capsule by mouth daily.    ? Coenzyme Q10 (CO Q 10) 100 MG CAPS Take 1 capsule by mouth daily.    ? furosemide (LASIX) 80 MG tablet Take 0.5 tablets by mouth daily.    ? glimepiride (AMARYL) 4 MG tablet Take 4 mg by mouth 2 (two) times daily.    ? hydrOXYzine (VISTARIL) 25 MG capsule Take 25 mg by mouth as needed for itching or  anxiety.    ? lactulose, encephalopathy, (CHRONULAC) 10 GM/15ML SOLN Take 15 mLs (10 g total) by mouth daily. 240 mL 0  ? Melatonin 1 MG CAPS Take 1 capsule by mouth at bedtime.    ? metFORMIN (GLUCOPHAGE) 1000 MG tablet Take 1,000 mg by mouth 2 (two) times daily with a meal.    ? MILK THISTLE PO Take 1 capsule by mouth daily.    ? Multiple Vitamin (MULTIVITAMIN) tablet Take 1 tablet by mouth daily.    ? pantoprazole (PROTONIX) 40 MG tablet Take 1 tablet (40 mg total) by mouth daily. 30 tablet 0  ? pioglitazone (ACTOS) 45 MG tablet Take 45 mg by mouth daily.    ? propranolol (INDERAL) 10 MG tablet Take 1 tablet by mouth three times daily with dosage titrated by heart rate (resting heart rate 55-65). 90 tablet 3  ? spironolactone (ALDACTONE) 100 MG tablet Take 1 tablet by mouth daily.    ? Tetrahydrozoline HCl (VISINE OP) Place 1 drop into both eyes daily as needed (Dry eyes).    ? ?No current facility-administered medications for this visit.  ? ?No Known Allergies ? ? ?Review of Systems: ?All systems reviewed and negative except where noted in HPI.  ? ? ?US BIOPSY (LIVER) ? ?Result Date:  11/13/2021 ?INDICATION: PSC cirrhosis.  Liver lesions on MR abdomen. EXAM: ULTRASOUND GUIDED LIVER LESION BIOPSY COMPARISON:  MRI abdomen and US Abdomen, 11/10/2021. US Abdomen, 04/15/2021. MEDICATIONS: Non

## 2021-12-11 NOTE — Patient Instructions (Signed)
If you are age 63 or older, your body mass index should be between 23-30. Your Body mass index is 19.8 kg/m?Marland Kitchen If this is out of the aforementioned range listed, please consider follow up with your Primary Care Provider. ? ?If you are age 38 or younger, your body mass index should be between 19-25. Your Body mass index is 19.8 kg/m?Marland Kitchen If this is out of the aformentioned range listed, please consider follow up with your Primary Care Provider.  ? ?Your provider has requested that you go to the basement level for lab work before leaving today. Press "B" on the elevator. The lab is located at the first door on the left as you exit the elevator. ? ? ?You have been scheduled for an abdominal paracentesis at [facility] (1st floor of hospital) on 12/16/21 at 10am. Please arrive at least 15 minutes prior to your appointment time for registration. Should you need to reschedule this appointment for any reason, please call our office at [phone]. ? ?The  GI providers would like to encourage you to use [facility] to communicate with providers for non-urgent requests or questions.  Due to long hold times on the telephone, sending your provider a message by [facility] may be a faster and more efficient way to get a response.  Please allow 48 business hours for a response.  Please remember that this is for non-urgent requests.  ? ?It was a pleasure to see you today! ? ?Thank you for trusting me with your gastrointestinal care!   ? ?Scott E.Tomasa Rand, MD ? ?

## 2021-12-16 ENCOUNTER — Ambulatory Visit (HOSPITAL_COMMUNITY)
Admission: RE | Admit: 2021-12-16 | Discharge: 2021-12-16 | Disposition: A | Discharge status: Home / Self Care | Payer: PPO | Source: Ambulatory Visit | Attending: Gastroenterology | Admitting: Gastroenterology

## 2021-12-16 DIAGNOSIS — K745 Biliary cirrhosis, unspecified: Secondary | ICD-10-CM | POA: Diagnosis not present

## 2021-12-16 DIAGNOSIS — R188 Other ascites: Secondary | ICD-10-CM | POA: Insufficient documentation

## 2021-12-16 HISTORY — PX: IR PARACENTESIS: IMG2679

## 2021-12-16 LAB — BODY FLUID CELL COUNT WITH DIFFERENTIAL
Eos, Fluid: 0 %
Lymphs, Fluid: 46 %
Monocyte-Macrophage-Serous Fluid: 39 % — ABNORMAL LOW (ref 50–90)
Neutrophil Count, Fluid: 15 % (ref 0–25)
Total Nucleated Cell Count, Fluid: 378 cu mm (ref 0–1000)

## 2021-12-16 LAB — GRAM STAIN

## 2021-12-16 MED ORDER — LIDOCAINE HCL (PF) 1 % IJ SOLN
INTRAMUSCULAR | Status: DC | PRN
  Administered 2021-12-16: 10 mL

## 2021-12-16 MED ORDER — LIDOCAINE HCL 1 % IJ SOLN
INTRAMUSCULAR | Status: AC
  Filled 2021-12-16: qty 20

## 2021-12-16 NOTE — Progress Notes (Signed)
[name] Ford,  ?Your electrolytes and kidney function look good.  I recommend you increase your lasix to 40 mg twice a day to better control your ascites.  Please be very vigilant about salt intake (less than 2 grams/day). Do you have an appointment scheduled with Interventional radiology for a repeat paracentesis?

## 2021-12-16 NOTE — Procedures (Addendum)
PROCEDURE SUMMARY: ? ?Successful ultrasound guided paracentesis from the right  lower quadrant.  ?Yielded 1.3 L  of straw colored fluid.  ?No immediate complications.  ?The patient tolerated the procedure well.  ? ?Specimen was sent for labs. ? ?EBL < 75mL ? ?The patient has required >/=2 paracenteses in a 30 day period and a screening evaluation by the [place] [facility] Portal Hypertension Clinic has been arranged. ? ? ? ? ?

## 2021-12-17 LAB — PATHOLOGIST SMEAR REVIEW

## 2021-12-21 LAB — CULTURE, BODY FLUID W GRAM STAIN -BOTTLE: Culture: NO GROWTH

## 2022-01-06 ENCOUNTER — Other Ambulatory Visit: Payer: Self-pay

## 2022-01-06 ENCOUNTER — Telehealth: Payer: Self-pay

## 2022-01-06 DIAGNOSIS — R188 Other ascites: Secondary | ICD-10-CM

## 2022-01-06 NOTE — Telephone Encounter (Signed)
Pt scheduled [facility] 01[facility] at 1pm, pt to arrive there at 12:30pm. Pt aware of appt.

## 2022-01-06 NOTE — Telephone Encounter (Signed)
Pt requesting to have another IR para scheduled. Please advise. Last one was done 5/16. Please advise.

## 2022-01-08 ENCOUNTER — Ambulatory Visit (HOSPITAL_COMMUNITY)
Admission: RE | Admit: 2022-01-08 | Discharge: 2022-01-08 | Disposition: A | Discharge status: Home / Self Care | Payer: PPO | Source: Ambulatory Visit | Attending: Gastroenterology | Admitting: Gastroenterology

## 2022-01-08 DIAGNOSIS — R188 Other ascites: Secondary | ICD-10-CM

## 2022-01-08 HISTORY — PX: IR PARACENTESIS: IMG2679

## 2022-01-08 LAB — BODY FLUID CELL COUNT WITH DIFFERENTIAL
Lymphs, Fluid: 16 %
Monocyte-Macrophage-Serous Fluid: 70 % (ref 50–90)
Neutrophil Count, Fluid: 14 % (ref 0–25)
Total Nucleated Cell Count, Fluid: 189 cu mm (ref 0–1000)

## 2022-01-08 LAB — GRAM STAIN

## 2022-01-08 MED ORDER — LIDOCAINE HCL 1 % IJ SOLN
INTRAMUSCULAR | Status: AC
  Filled 2022-01-08: qty 20

## 2022-01-08 MED ORDER — LIDOCAINE HCL (PF) 1 % IJ SOLN
INTRAMUSCULAR | Status: DC | PRN
  Administered 2022-01-08: 10 mL

## 2022-01-08 NOTE — Procedures (Addendum)
PROCEDURE SUMMARY:  Successful US guided paracentesis from RLQ.  Yielded 2.5L of clear ascitic fluid.  No immediate complications.  Pt tolerated well.   Specimen was sent for labs.  EBL < 58mL  The patient has required >/=2 paracenteses in a 30 day period and a screening evaluation by the [place] [facility] Portal Hypertension Clinic has been arranged.  Neria Procter PA-C 01/08/2022 3:44 PM

## 2022-01-09 LAB — PATHOLOGIST SMEAR REVIEW

## 2022-01-13 LAB — CULTURE, BODY FLUID W GRAM STAIN -BOTTLE: Culture: NO GROWTH

## 2022-01-14 ENCOUNTER — Other Ambulatory Visit: Payer: Self-pay

## 2022-01-19 ENCOUNTER — Other Ambulatory Visit (HOSPITAL_COMMUNITY): Payer: Self-pay

## 2022-01-23 ENCOUNTER — Other Ambulatory Visit: Payer: Self-pay

## 2022-01-23 ENCOUNTER — Telehealth: Payer: Self-pay | Admitting: Gastroenterology

## 2022-01-23 DIAGNOSIS — R188 Other ascites: Secondary | ICD-10-CM

## 2022-01-23 MED ORDER — SPIRONOLACTONE 100 MG PO TABS: 100.0000 mg | ORAL_TABLET | Freq: Every day | ORAL | 6 refills | Status: DC

## 2022-01-23 MED ORDER — FUROSEMIDE 80 MG PO TABS: 40.0000 mg | ORAL_TABLET | Freq: Every day | ORAL | 6 refills | Status: DC

## 2022-01-23 NOTE — Telephone Encounter (Signed)
Pt calling requesting to have an IR Para done. Please advise.

## 2022-01-23 NOTE — Telephone Encounter (Signed)
New script for lasix sent in for pt to his pharmacy.

## 2022-01-23 NOTE — Telephone Encounter (Signed)
Notified pt that the lasix has been called to the pharmacy. Pt also stated that he was out of aldactone. New script sent to pharmacy for the pt for this also. He is aware.

## 2022-01-26 ENCOUNTER — Ambulatory Visit (HOSPITAL_COMMUNITY)
Admission: RE | Admit: 2022-01-26 | Discharge: 2022-01-26 | Disposition: A | Discharge status: Home / Self Care | Payer: PPO | Source: Ambulatory Visit | Attending: Gastroenterology | Admitting: Gastroenterology

## 2022-01-26 DIAGNOSIS — R188 Other ascites: Secondary | ICD-10-CM | POA: Diagnosis present

## 2022-01-26 HISTORY — PX: IR PARACENTESIS: IMG2679

## 2022-01-26 LAB — BODY FLUID CELL COUNT WITH DIFFERENTIAL
Eos, Fluid: 0 %
Lymphs, Fluid: 52 %
Monocyte-Macrophage-Serous Fluid: 37 % — ABNORMAL LOW (ref 50–90)
Neutrophil Count, Fluid: 11 % (ref 0–25)
Total Nucleated Cell Count, Fluid: 303 cu mm (ref 0–1000)

## 2022-01-26 MED ORDER — LIDOCAINE HCL 1 % IJ SOLN
INTRAMUSCULAR | Status: AC
  Filled 2022-01-26: qty 20

## 2022-01-26 MED ORDER — LIDOCAINE HCL (PF) 1 % IJ SOLN
INTRAMUSCULAR | Status: DC | PRN
  Administered 2022-01-26: 10 mL

## 2022-01-26 NOTE — Progress Notes (Signed)
PROCEDURE SUMMARY:  Successful US guided paracentesis from RLQ.  Yielded 4.3L of clear ascitic fluid.  No immediate complications.  Pt tolerated well.   Specimen was sent for labs.  EBL < 5mL  Tevyn Codd PA-C 01/26/2022 10:58 AM

## 2022-01-27 ENCOUNTER — Other Ambulatory Visit (HOSPITAL_COMMUNITY): Payer: Self-pay

## 2022-01-27 LAB — PATHOLOGIST SMEAR REVIEW

## 2022-01-30 ENCOUNTER — Other Ambulatory Visit (HOSPITAL_COMMUNITY): Payer: Self-pay

## 2022-02-10 ENCOUNTER — Telehealth: Payer: Self-pay | Admitting: Gastroenterology

## 2022-02-10 NOTE — Telephone Encounter (Signed)
Sent a my chart message as follows:  "I need an appointment for a IR Paracentesis. Thank you, Izell [place]. "

## 2022-02-10 NOTE — Telephone Encounter (Signed)
Pt requesting IR paracentesis. Please advise.

## 2022-02-11 ENCOUNTER — Other Ambulatory Visit: Payer: Self-pay

## 2022-02-11 DIAGNOSIS — R188 Other ascites: Secondary | ICD-10-CM

## 2022-02-11 NOTE — Telephone Encounter (Signed)
Pt states he is taking the lasix and aldactone as prescribed. He will come Friday for cmp, order in epic. He is scheduled for IR para at [facility] 7/17 @1pm . Pt aware of appt.

## 2022-02-13 ENCOUNTER — Other Ambulatory Visit (INDEPENDENT_AMBULATORY_CARE_PROVIDER_SITE_OTHER): Payer: PPO

## 2022-02-13 DIAGNOSIS — R188 Other ascites: Secondary | ICD-10-CM

## 2022-02-13 LAB — COMPREHENSIVE METABOLIC PANEL
ALT: 29 U/L (ref 0–53)
AST: 36 U/L (ref 0–37)
Albumin: 3.6 g/dL (ref 3.5–5.2)
Alkaline Phosphatase: 257 U/L — ABNORMAL HIGH (ref 39–117)
BUN: 24 mg/dL — ABNORMAL HIGH (ref 6–23)
CO2: 28 mEq/L (ref 19–32)
Calcium: 9.2 mg/dL (ref 8.4–10.5)
Chloride: 100 mEq/L (ref 96–112)
Creatinine, Ser: 0.84 mg/dL (ref 0.40–1.50)
GFR: 93 mL/min (ref >60.00)
Glucose, Bld: 167 mg/dL — ABNORMAL HIGH (ref 70–99)
Potassium: 4 mEq/L (ref 3.5–5.1)
Sodium: 136 mEq/L (ref 135–145)
Total Bilirubin: 0.5 mg/dL (ref 0.2–1.2)
Total Protein: 6.5 g/dL (ref 6.0–8.3)

## 2022-02-13 NOTE — Progress Notes (Signed)
[name] Ford,  Your kidney function and electrolytes looked okay.  Your BUN was slightly elevated, which may indicate that you need to drink more water.  The medications that help your body get rid of the fluid in your abdomen can make you dehydrated.    Your kidneys are functioning well and I think they would tolerate increasing your diuretics so that you do not need to get paracenteses as often.  I recommend you increase your Lasix to 80 mg in the morning and your Aldactone to 200 mg daily.  I would like to recheck your kidney function and electrolytes 2 weeks after you make this change.

## 2022-02-16 ENCOUNTER — Ambulatory Visit (HOSPITAL_COMMUNITY)
Admission: RE | Admit: 2022-02-16 | Discharge: 2022-02-16 | Disposition: A | Discharge status: Home / Self Care | Payer: PPO | Source: Ambulatory Visit | Attending: Gastroenterology | Admitting: Gastroenterology

## 2022-02-16 ENCOUNTER — Ambulatory Visit (HOSPITAL_COMMUNITY)
Admission: RE | Admit: 2022-02-16 | Discharge: 2022-02-16 | Disposition: A | Discharge status: Home / Self Care | Payer: PPO | Source: Ambulatory Visit | Attending: Physician Assistant | Admitting: Physician Assistant

## 2022-02-16 ENCOUNTER — Other Ambulatory Visit: Payer: Self-pay

## 2022-02-16 ENCOUNTER — Other Ambulatory Visit (HOSPITAL_COMMUNITY): Payer: Self-pay | Admitting: Physician Assistant

## 2022-02-16 DIAGNOSIS — R188 Other ascites: Secondary | ICD-10-CM | POA: Diagnosis present

## 2022-02-16 DIAGNOSIS — Z9889 Other specified postprocedural states: Secondary | ICD-10-CM

## 2022-02-16 DIAGNOSIS — K745 Biliary cirrhosis, unspecified: Secondary | ICD-10-CM

## 2022-02-16 HISTORY — PX: IR PARACENTESIS: IMG2679

## 2022-02-16 LAB — BODY FLUID CELL COUNT WITH DIFFERENTIAL
Eos, Fluid: 0 %
Lymphs, Fluid: 62 %
Monocyte-Macrophage-Serous Fluid: 27 % — ABNORMAL LOW (ref 50–90)
Neutrophil Count, Fluid: 11 % (ref 0–25)
Total Nucleated Cell Count, Fluid: 185 cu mm (ref 0–1000)

## 2022-02-16 LAB — GRAM STAIN

## 2022-02-16 MED ORDER — LIDOCAINE HCL (PF) 1 % IJ SOLN
INTRAMUSCULAR | Status: DC | PRN
  Administered 2022-02-16: 10 mL

## 2022-02-16 MED ORDER — LIDOCAINE HCL 1 % IJ SOLN
INTRAMUSCULAR | Status: AC
  Filled 2022-02-16: qty 20

## 2022-02-16 NOTE — Procedures (Signed)
PROCEDURE SUMMARY:  Successful ultrasound guided paracentesis from the right lower quadrant.  Yielded 4.1 of straw fluid.  No immediate complications.  The patient tolerated the procedure well.   Specimen was sent for labs.  EBL < 3mL  The patient has required >/=2 paracenteses in a 30 day period and a screening evaluation by the [facility] Interventional Radiology Portal Hypertension Clinic has been arranged.

## 2022-02-18 LAB — PATHOLOGIST SMEAR REVIEW: Path Review: NEGATIVE

## 2022-02-21 LAB — CULTURE, BODY FLUID W GRAM STAIN -BOTTLE: Culture: NO GROWTH

## 2022-03-02 ENCOUNTER — Telehealth: Payer: Self-pay

## 2022-03-02 NOTE — Telephone Encounter (Signed)
Pt aware, order in for labs.

## 2022-03-02 NOTE — Telephone Encounter (Signed)
-----   Message from Chrystie Nose, RN sent at 02/16/2022 10:30 AM EDT ----- Regarding: CMP Pt needs labs in 2 weeks, orders in epic.

## 2022-03-06 ENCOUNTER — Other Ambulatory Visit (INDEPENDENT_AMBULATORY_CARE_PROVIDER_SITE_OTHER): Payer: PPO

## 2022-03-06 DIAGNOSIS — K745 Biliary cirrhosis, unspecified: Secondary | ICD-10-CM

## 2022-03-06 LAB — COMPREHENSIVE METABOLIC PANEL
ALT: 45 U/L (ref 0–53)
AST: 53 U/L — ABNORMAL HIGH (ref 0–37)
Albumin: 4 g/dL (ref 3.5–5.2)
Alkaline Phosphatase: 304 U/L — ABNORMAL HIGH (ref 39–117)
BUN: 32 mg/dL — ABNORMAL HIGH (ref 6–23)
CO2: 28 mEq/L (ref 19–32)
Calcium: 9.7 mg/dL (ref 8.4–10.5)
Chloride: 95 mEq/L — ABNORMAL LOW (ref 96–112)
Creatinine, Ser: 1.07 mg/dL (ref 0.40–1.50)
GFR: 73.98 mL/min (ref >60.00)
Glucose, Bld: 139 mg/dL — ABNORMAL HIGH (ref 70–99)
Potassium: 4.1 mEq/L (ref 3.5–5.1)
Sodium: 132 mEq/L — ABNORMAL LOW (ref 135–145)
Total Bilirubin: 0.4 mg/dL (ref 0.2–1.2)
Total Protein: 7.1 g/dL (ref 6.0–8.3)

## 2022-03-12 ENCOUNTER — Other Ambulatory Visit: Payer: Self-pay

## 2022-03-12 DIAGNOSIS — K745 Biliary cirrhosis, unspecified: Secondary | ICD-10-CM

## 2022-03-12 NOTE — Progress Notes (Signed)
[name] Ford,  Your labs looked ok.  Your BUN and creatinine have increased slightly, which may indicate we may be diuresing a little much.  This is a balancing act we have to navigate to limit the frequency of you needing to get fluid removed.   Limiting sodium intake to less than 2 grams per day is the most important thing you can do in addition to taking your medications to reduce the amount of ascites. I would recommend continuing your current doses of lasix and aldactone, and lets check your renal function again in another 2 weeks.  If your creatinine is increasing still, we may need to back off on the doses.

## 2022-03-27 ENCOUNTER — Other Ambulatory Visit (INDEPENDENT_AMBULATORY_CARE_PROVIDER_SITE_OTHER): Payer: PPO

## 2022-03-27 DIAGNOSIS — K745 Biliary cirrhosis, unspecified: Secondary | ICD-10-CM

## 2022-03-27 LAB — COMPREHENSIVE METABOLIC PANEL
ALT: 35 U/L (ref 0–53)
AST: 41 U/L — ABNORMAL HIGH (ref 0–37)
Albumin: 3.7 g/dL (ref 3.5–5.2)
Alkaline Phosphatase: 270 U/L — ABNORMAL HIGH (ref 39–117)
BUN: 21 mg/dL (ref 6–23)
CO2: 27 mEq/L (ref 19–32)
Calcium: 8.9 mg/dL (ref 8.4–10.5)
Chloride: 99 mEq/L (ref 96–112)
Creatinine, Ser: 0.83 mg/dL (ref 0.40–1.50)
GFR: 93.26 mL/min (ref >60.00)
Glucose, Bld: 128 mg/dL — ABNORMAL HIGH (ref 70–99)
Potassium: 3.9 mEq/L (ref 3.5–5.1)
Sodium: 131 mEq/L — ABNORMAL LOW (ref 135–145)
Total Bilirubin: 0.5 mg/dL (ref 0.2–1.2)
Total Protein: 6.5 g/dL (ref 6.0–8.3)

## 2022-03-30 ENCOUNTER — Other Ambulatory Visit: Payer: Self-pay

## 2022-03-30 DIAGNOSIS — K745 Biliary cirrhosis, unspecified: Secondary | ICD-10-CM

## 2022-03-30 NOTE — Telephone Encounter (Signed)
Pt scheduled for IR para at [facility] max of 8 liters, fluid for cell count for diff/culture. Scheduled for 8/29 at 9am, pt to arrive there at 8:30am. Pt aware of appt.

## 2022-03-31 ENCOUNTER — Ambulatory Visit (HOSPITAL_COMMUNITY)
Admission: RE | Admit: 2022-03-31 | Discharge: 2022-03-31 | Disposition: A | Discharge status: Home / Self Care | Payer: PPO | Source: Ambulatory Visit | Attending: Gastroenterology | Admitting: Gastroenterology

## 2022-03-31 DIAGNOSIS — K745 Biliary cirrhosis, unspecified: Secondary | ICD-10-CM | POA: Insufficient documentation

## 2022-03-31 DIAGNOSIS — R188 Other ascites: Secondary | ICD-10-CM | POA: Diagnosis not present

## 2022-03-31 HISTORY — PX: IR PARACENTESIS: IMG2679

## 2022-03-31 LAB — GRAM STAIN: Gram Stain: NONE SEEN

## 2022-03-31 LAB — BODY FLUID CELL COUNT WITH DIFFERENTIAL
Eos, Fluid: 0 %
Lymphs, Fluid: 36 %
Monocyte-Macrophage-Serous Fluid: 55 % (ref 50–90)
Neutrophil Count, Fluid: 9 % (ref 0–25)
Total Nucleated Cell Count, Fluid: 261 cu mm (ref 0–1000)

## 2022-03-31 MED ORDER — LIDOCAINE HCL 1 % IJ SOLN
INTRAMUSCULAR | Status: AC
  Filled 2022-03-31: qty 20

## 2022-04-02 LAB — PATHOLOGIST SMEAR REVIEW

## 2022-04-05 LAB — CULTURE, BODY FLUID W GRAM STAIN -BOTTLE: Culture: NO GROWTH

## 2022-04-15 ENCOUNTER — Telehealth: Payer: Self-pay | Admitting: Gastroenterology

## 2022-04-16 ENCOUNTER — Other Ambulatory Visit: Payer: Self-pay

## 2022-04-16 DIAGNOSIS — R188 Other ascites: Secondary | ICD-10-CM

## 2022-04-16 NOTE — Telephone Encounter (Signed)
Pt scheduled for IR Para at [facility] 04/20/22 at 9am, pt to arrive there at 8:30am through entrance C. Pt scheduled to see Dr. Tomasa Rand 05/20/22 at 9am.

## 2022-04-16 NOTE — Telephone Encounter (Signed)
See note below and advise regarding IR para.

## 2022-04-17 NOTE — Telephone Encounter (Signed)
Left detailed message for pt regarding appts. Message also sent to pt via mychart.

## 2022-04-20 ENCOUNTER — Other Ambulatory Visit: Payer: Self-pay | Admitting: Gastroenterology

## 2022-04-20 ENCOUNTER — Ambulatory Visit (HOSPITAL_COMMUNITY)
Admission: RE | Admit: 2022-04-20 | Discharge: 2022-04-20 | Disposition: A | Discharge status: Home / Self Care | Payer: PPO | Source: Ambulatory Visit | Attending: Gastroenterology | Admitting: Gastroenterology

## 2022-04-20 DIAGNOSIS — R188 Other ascites: Secondary | ICD-10-CM

## 2022-04-20 HISTORY — PX: IR PARACENTESIS: IMG2679

## 2022-04-20 LAB — BODY FLUID CELL COUNT WITH DIFFERENTIAL
Eos, Fluid: 0 %
Lymphs, Fluid: 53 %
Monocyte-Macrophage-Serous Fluid: 31 % — ABNORMAL LOW (ref 50–90)
Neutrophil Count, Fluid: 16 % (ref 0–25)
Total Nucleated Cell Count, Fluid: 148 cu mm (ref 0–1000)

## 2022-04-20 LAB — GRAM STAIN

## 2022-04-20 MED ORDER — LIDOCAINE HCL 1 % IJ SOLN
INTRAMUSCULAR | Status: AC
  Administered 2022-04-20: 10 mL
  Filled 2022-04-20: qty 20

## 2022-04-20 MED ORDER — ALBUMIN HUMAN 25 % IV SOLN
INTRAVENOUS | Status: AC
  Filled 2022-04-20: qty 200

## 2022-04-20 MED ORDER — ALBUMIN HUMAN 25 % IV SOLN
50.0000 g | Freq: Once | INTRAVENOUS | Status: AC
  Administered 2022-04-20: 50 g via INTRAVENOUS
  Filled 2022-04-20: qty 200

## 2022-04-20 NOTE — Procedures (Signed)
PROCEDURE SUMMARY:  Successful ultrasound guided diagnostic and therapeutic paracentesis from the RLQ. Yielded 8 L of clear, yellow fluid.  No immediate complications.  The patient tolerated the procedure well.   Specimen was sent for labs.  EBL < 1 ml  If the patient eventually requires >/=2 paracenteses in a 30 day period, screening evaluation by the [place] [facility]pertension [facility] will be assessed.    Narda Rutherford, AGNP-BC 04/20/2022, 10:49 AM

## 2022-04-22 LAB — PATHOLOGIST SMEAR REVIEW

## 2022-04-25 LAB — CULTURE, BODY FLUID W GRAM STAIN -BOTTLE: Culture: NO GROWTH

## 2022-04-29 ENCOUNTER — Other Ambulatory Visit (HOSPITAL_COMMUNITY): Payer: Self-pay | Admitting: Gastroenterology

## 2022-04-29 ENCOUNTER — Ambulatory Visit (HOSPITAL_COMMUNITY)
Admission: RE | Admit: 2022-04-29 | Discharge: 2022-04-29 | Disposition: A | Discharge status: Home / Self Care | Payer: PPO | Source: Ambulatory Visit | Attending: Gastroenterology | Admitting: Gastroenterology

## 2022-04-29 DIAGNOSIS — R188 Other ascites: Secondary | ICD-10-CM | POA: Diagnosis present

## 2022-04-29 HISTORY — PX: IR PARACENTESIS: IMG2679

## 2022-04-29 MED ORDER — LIDOCAINE HCL (PF) 1 % IJ SOLN
INTRAMUSCULAR | Status: DC | PRN
  Administered 2022-04-29: 10 mL

## 2022-04-29 MED ORDER — LIDOCAINE HCL 1 % IJ SOLN
INTRAMUSCULAR | Status: AC
  Filled 2022-04-29: qty 20

## 2022-04-29 NOTE — Procedures (Signed)
PROCEDURE SUMMARY:  Successful image-guided paracentesis from the right lower abdomen.  Yielded 8.0 liters of yellow fluid.  No immediate complications.  EBL = trace. Patient tolerated well.   Specimen was not sent for labs. Patient declined albumin.  Please see imaging section of Epic for full dictation.   Lura Em PA-C 04/29/2022 10:53 AM

## 2022-04-29 NOTE — Progress Notes (Signed)
   Portal Hypertension Clinic Screening Evaluation   Indication for evaluation: [name] Ford is a 61 y.o. male undergoing preliminary evaluation in the [place] [facility] Portal Hypertension [facility] due to recurrent ascites.  Referring Physician/Established Gastroenterologist:  Dustin Flock, MD  Etiology of cirrhosis: Biliary Initially diagnosed: 2010 # of paracentesis in last month: 3 # of paracentesis in last 2 months: 3 History of hepatic hydrothorax:  No History of hepatic encephalopathy: Yes (mild)  Prior evaluation for liver transplant: No, not interested History of hepatocellular carcinoma: No  Prior esophagogastroduodenoscopy/intervention: Yes (11/11/21 - Grade II EVs banded, portal gastropathy) Current esophageal varices: Yes Current gastric varices: No History of hematemesis: Yes  Current diuretic regimen: furosemide 40 mg QD, spironolactone 100 mg QD Current pharmacologic encephalopathy prophylaxis/treatment: lactulose 15 mL QD  History of renal dysfunction: No History of hemodialysis: No  History of cardiac dysfunction: No  Other pertinent past medical history:  Anxiety/depression, coronary artery disease status post myocardial infarction (2013), diabetes, GERD, hyperlipidemia, polysubstance abuse   Imaging: Prior cross sectional imaging of portal system: MR 11/10/21  Severely nodular/heterogenous liver parenchyma, patent portal system, esophageal varices  Echocardiogram: None recent   Labs: 11/11/21 - 03/27/22 Creatinine: 0.83 Total Bilirubin: 0.5 INR: 1.3 Sodium: 131 Albumin: 3.7  Child-Pugh = 8 points, class B MELD = 9 (6.0% estimated 3 month mortality)  [place] Index of Post-TIPS Survival (FIPS) = -0.49 (overall survival predicted at 1 month 97.2%, 3 months 90.9%, and 6 months 86.5%)    Assessment: RAWN QUIROA is a 61 y.o. male with history of biliary cirrhosis (Child Pugh B, MELD 9) with recurrent ascites and history  of bleeding esophageal varices (s/p banding), with mild suspected hepatic encephalopathy.  After preliminary evaluation, this patient would be a favorable candidate for TIPS creation, particularly if diuretic maximization is limited.  Recommendation: Formal consult for TIPS creation could be considered.  The patient's Gastroenterologist, Dr. Candis Schatz, will be contacted for further discussion.    Electronically Signed: Suzette Battiest, MD 04/29/2022, 12:08 PM

## 2022-05-06 ENCOUNTER — Other Ambulatory Visit (HOSPITAL_COMMUNITY): Payer: Self-pay | Admitting: Gastroenterology

## 2022-05-06 ENCOUNTER — Ambulatory Visit (HOSPITAL_COMMUNITY)
Admission: RE | Admit: 2022-05-06 | Discharge: 2022-05-06 | Disposition: A | Discharge status: Home / Self Care | Payer: PPO | Source: Ambulatory Visit | Attending: Gastroenterology | Admitting: Gastroenterology

## 2022-05-06 DIAGNOSIS — R188 Other ascites: Secondary | ICD-10-CM | POA: Diagnosis not present

## 2022-05-06 HISTORY — PX: IR PARACENTESIS: IMG2679

## 2022-05-06 MED ORDER — LIDOCAINE HCL 1 % IJ SOLN
INTRAMUSCULAR | Status: DC | PRN
  Administered 2022-05-06: 5 mL

## 2022-05-06 MED ORDER — LIDOCAINE HCL 1 % IJ SOLN
INTRAMUSCULAR | Status: AC
  Filled 2022-05-06: qty 20

## 2022-05-06 NOTE — Procedures (Signed)
PROCEDURE SUMMARY:  Successful ultrasound guided paracentesis from the right lower quadrant.  Yielded 8 L of clear yellow fluid.  No immediate complications.  The patient tolerated the procedure well.   Specimen not sent for labs.  EBL < 29mL  The patient has previously been formally evaluated by the [facility] and is being actively followed for potential future intervention.  [name] Ford, [place] [phone] 05/06/2022, 9:44 AM

## 2022-05-13 ENCOUNTER — Other Ambulatory Visit (HOSPITAL_COMMUNITY): Payer: Self-pay | Admitting: Gastroenterology

## 2022-05-13 ENCOUNTER — Ambulatory Visit (HOSPITAL_COMMUNITY)
Admission: RE | Admit: 2022-05-13 | Discharge: 2022-05-13 | Disposition: A | Discharge status: Home / Self Care | Payer: PPO | Source: Ambulatory Visit | Attending: Gastroenterology | Admitting: Gastroenterology

## 2022-05-13 DIAGNOSIS — R188 Other ascites: Secondary | ICD-10-CM | POA: Diagnosis not present

## 2022-05-13 DIAGNOSIS — K7460 Unspecified cirrhosis of liver: Secondary | ICD-10-CM

## 2022-05-13 HISTORY — PX: IR PARACENTESIS: IMG2679

## 2022-05-13 MED ORDER — LIDOCAINE HCL (PF) 1 % IJ SOLN
INTRAMUSCULAR | Status: DC | PRN
  Administered 2022-05-13: 10 mL

## 2022-05-13 MED ORDER — LIDOCAINE HCL 1 % IJ SOLN
INTRAMUSCULAR | Status: AC
  Filled 2022-05-13: qty 20

## 2022-05-13 NOTE — Procedures (Signed)
PROCEDURE SUMMARY:  Successful image-guided paracentesis from the left lower abdomen.  Yielded 8.0 liters of clear yellow fluid.  No immediate complications.  EBL = trace. Patient tolerated well.   Specimen was not sent for labs.  The patient has previously been formally evaluated by the [facility] and is being actively followed for potential future intervention.  Please see imaging section of Epic for full dictation.   Lura Em PA-C 05/13/2022 10:05 AM

## 2022-05-20 ENCOUNTER — Telehealth (INDEPENDENT_AMBULATORY_CARE_PROVIDER_SITE_OTHER): Payer: PPO | Admitting: Gastroenterology

## 2022-05-20 ENCOUNTER — Encounter: Payer: Self-pay | Admitting: Gastroenterology

## 2022-05-20 ENCOUNTER — Other Ambulatory Visit (INDEPENDENT_AMBULATORY_CARE_PROVIDER_SITE_OTHER): Payer: PPO

## 2022-05-20 ENCOUNTER — Other Ambulatory Visit: Payer: PPO

## 2022-05-20 ENCOUNTER — Ambulatory Visit (HOSPITAL_COMMUNITY)
Admission: RE | Admit: 2022-05-20 | Discharge: 2022-05-20 | Disposition: A | Discharge status: Home / Self Care | Payer: PPO | Source: Ambulatory Visit | Attending: Gastroenterology | Admitting: Gastroenterology

## 2022-05-20 ENCOUNTER — Other Ambulatory Visit (HOSPITAL_COMMUNITY): Payer: Self-pay | Admitting: Gastroenterology

## 2022-05-20 VITALS — BP 128/72 | HR 76 | Ht 70.0 in | Wt 152.0 lb

## 2022-05-20 DIAGNOSIS — R188 Other ascites: Secondary | ICD-10-CM

## 2022-05-20 DIAGNOSIS — K7460 Unspecified cirrhosis of liver: Secondary | ICD-10-CM

## 2022-05-20 DIAGNOSIS — K745 Biliary cirrhosis, unspecified: Secondary | ICD-10-CM

## 2022-05-20 DIAGNOSIS — K7682 Hepatic encephalopathy: Secondary | ICD-10-CM

## 2022-05-20 DIAGNOSIS — I8511 Secondary esophageal varices with bleeding: Secondary | ICD-10-CM

## 2022-05-20 DIAGNOSIS — K5900 Constipation, unspecified: Secondary | ICD-10-CM

## 2022-05-20 HISTORY — PX: IR PARACENTESIS: IMG2679

## 2022-05-20 LAB — CBC WITH DIFFERENTIAL/PLATELET
Basophils Absolute: 0.1 10*3/uL (ref 0.0–0.1)
Basophils Relative: 0.7 % (ref 0.0–3.0)
Eosinophils Absolute: 0 10*3/uL (ref 0.0–0.7)
Eosinophils Relative: 0.5 % (ref 0.0–5.0)
HCT: 39.9 % (ref 39.0–52.0)
Hemoglobin: 12.8 g/dL — ABNORMAL LOW (ref 13.0–17.0)
Lymphocytes Relative: 7.4 % — ABNORMAL LOW (ref 12.0–46.0)
Lymphs Abs: 0.5 10*3/uL — ABNORMAL LOW (ref 0.7–4.0)
MCHC: 32.1 g/dL (ref 30.0–36.0)
MCV: 72.3 fl — ABNORMAL LOW (ref 78.0–100.0)
Monocytes Absolute: 1 10*3/uL (ref 0.1–1.0)
Monocytes Relative: 14.3 % — ABNORMAL HIGH (ref 3.0–12.0)
Neutro Abs: 5.3 10*3/uL (ref 1.4–7.7)
Neutrophils Relative %: 77.1 % — ABNORMAL HIGH (ref 43.0–77.0)
Platelets: 183 10*3/uL (ref 150.0–400.0)
RBC: 5.52 Mil/uL (ref 4.22–5.81)
RDW: 18.2 % — ABNORMAL HIGH (ref 11.5–15.5)
WBC: 6.8 10*3/uL (ref 4.0–10.5)

## 2022-05-20 LAB — HEPATIC FUNCTION PANEL
ALT: 59 U/L — ABNORMAL HIGH (ref 0–53)
AST: 64 U/L — ABNORMAL HIGH (ref 0–37)
Albumin: 3.2 g/dL — ABNORMAL LOW (ref 3.5–5.2)
Alkaline Phosphatase: 385 U/L — ABNORMAL HIGH (ref 39–117)
Bilirubin, Direct: 0.1 mg/dL (ref 0.0–0.3)
Total Bilirubin: 0.7 mg/dL (ref 0.2–1.2)
Total Protein: 6.4 g/dL (ref 6.0–8.3)

## 2022-05-20 LAB — PROTIME-INR
INR: 1.1 ratio — ABNORMAL HIGH (ref 0.8–1.0)
Prothrombin Time: 11.8 s (ref 9.6–13.1)

## 2022-05-20 LAB — BASIC METABOLIC PANEL
BUN: 32 mg/dL — ABNORMAL HIGH (ref 6–23)
CO2: 25 mEq/L (ref 19–32)
Calcium: 9.3 mg/dL (ref 8.4–10.5)
Chloride: 96 mEq/L (ref 96–112)
Creatinine, Ser: 0.92 mg/dL (ref 0.40–1.50)
GFR: 88.55 mL/min (ref >60.00)
Glucose, Bld: 166 mg/dL — ABNORMAL HIGH (ref 70–99)
Potassium: 4.9 mEq/L (ref 3.5–5.1)
Sodium: 128 mEq/L — ABNORMAL LOW (ref 135–145)

## 2022-05-20 LAB — FOLATE: Folate: 10.3 ng/mL (ref >5.9)

## 2022-05-20 LAB — VITAMIN D 25 HYDROXY (VIT D DEFICIENCY, FRACTURES): VITD: 16.61 ng/mL — ABNORMAL LOW (ref 30.00–100.00)

## 2022-05-20 LAB — VITAMIN B12: Vitamin B-12: >1500 pg/mL — ABNORMAL HIGH (ref 211–911)

## 2022-05-20 MED ORDER — LACTULOSE ENCEPHALOPATHY 10 GM/15ML PO SOLN: 10.0000 g | ORAL | 0 refills | Status: DC | PRN

## 2022-05-20 MED ORDER — LIDOCAINE HCL 1 % IJ SOLN
INTRAMUSCULAR | Status: AC
  Filled 2022-05-20: qty 20

## 2022-05-20 MED ORDER — URSODIOL 300 MG PO CAPS: 300.0000 mg | ORAL_CAPSULE | Freq: Three times a day (TID) | ORAL | 3 refills | Status: DC

## 2022-05-20 NOTE — Patient Instructions (Signed)
_______________________________________________________  If you are age 63 or older, your body mass index should be between 23-30. Your Body mass index is 21.81 kg/m. If this is out of the aforementioned range listed, please consider follow up with your Primary Care Provider.  If you are age 44 or younger, your body mass index should be between 19-25. Your Body mass index is 21.81 kg/m. If this is out of the aformentioned range listed, please consider follow up with your Primary Care Provider.   You will be contacted by [place] in the next 2 days to arrange a MRI Liver.  The number on your caller ID will be [phone], please answer when they call.  If you have not heard from them in 2 days please call [phone] to schedule.     You have been scheduled for an MRI at [facility] on 05/22/22. Your appointment time is 12pm. Please arrive to admitting (at main entrance of the hospital) 15 minutes prior to your appointment time for registration purposes. Please make certain not to have anything to eat or drink 6 hours prior to your test. In addition, if you have any metal in your body, have a pacemaker or defibrillator, please be sure to let your ordering physician know. This test typically takes 45 minutes to 1 hour to complete. Should you need to reschedule, please call [phone] to do so.   Your provider has requested that you go to the basement level for lab work before leaving today. Press "B" on the elevator. The lab is located at the first door on the left as you exit the elevator.  We have sent the following medications to your pharmacy for you to pick up at your convenience: Ursodiol 300 mg three times daily. Lactulose 15 ml every 6 hours as needed  The [facility] providers would like to encourage you to use [facility] to communicate with providers for non-urgent requests or questions.  Due to long hold times on the telephone, sending your provider a message by  [facility] may be a faster and more efficient way to get a response.  Please allow 48 business hours for a response.  Please remember that this is for non-urgent requests.   Due to recent changes in healthcare laws, you may see the results of your imaging and laboratory studies on MyChart before your provider has had a chance to review them.  We understand that in some cases there may be results that are confusing or concerning to you. Not all laboratory results come back in the same time frame and the provider may be waiting for multiple results in order to interpret others.  Please give [place] 48 hours in order for your provider to thoroughly review all the results before contacting the office for clarification of your results.    It was a pleasure to see you today!  Thank you for trusting me with your gastrointestinal care!    Scott E.Candis Schatz, MD

## 2022-05-20 NOTE — Progress Notes (Signed)
HPI : [name] Ford is a pleasant 61 year old male with decompensated cirrhosis (bleeding esophageal varices, refractory ascites requiring serial therapeutic paracentesis) who presents to clinic for follow-up.  The etiology of his cirrhosis remains unclear.  Initially, he was thought to have primary sclerosing cholangitis, but during his admission to the hospital in April he underwent a repeat liver biopsy which showed features more consistent with primary biliary cirrhosis.  Antimitochondrial antibodies were negative.  At his last office visit in May, he was recommended to repeat upper endoscopy with variceal banding given his history of bleeding varices.  He was also recommended to undergo initial colonoscopy for colon cancer screening and also to evaluate for inflammatory bowel disease due to the questionable diagnosis of [place].  He was also recommended to see a hepatologist given the complexity of his case.  He declined all these interventions, mostly citing financial concerns.  He also plainly states he is not interested in a liver transplant evaluation.  Since his last office visit, his ascites has become much more of an issue.  He has been needing large-volume paracentesis 2-3 times a month, usually removing 8 L of fluid. The patient states that he stopped taking all of his diuretics a few weeks ago because of the side effects he perceives from those medications.  When asked what side effects he was experiencing, he lists fatigue, insomnia, dizziness, headaches, confusion, leg cramps, dry mouth and constipation.  When I told him that many of the symptoms are associated with cirrhosis itself, he states that the symptoms are much less noticeable when he is not taking the diuretics, and he feels better off the diuretics compared to when he is taking the diuretics.  He reports that he tries to adhere to a low-sodium diet, but he does not track his sodium intake. He is taking his propranolol, but typically just  takes it once a day.  He says that if he takes it more than once a day, he is more likely to have low blood pressure and feel lightheaded.  Sometimes his blood pressure gets as low as 90s over 60s.  He takes his blood pressure every day.  His resting heart rate is usually in the 70s. For his hepatic encephalopathy, he was prescribed rifaximin, but did not fill it because it was too expensive.  He has previously said that lactulose caused too much diarrhea so has not been taking it.  He does admit to have any issues with head fog and insomnia which are likely secondary to hepatic encephalopathy.  He has been having some issues with constipation recently as well as burning epigastric pain.  He denies any melenic stools or hematochezia.  Patient reports feeling poorly overall.  He is very tired most the time.  His abdominal swelling becomes very uncomfortable.  He is lower extremity swelling which is also uncomfortable. He denies any history of yellowing of his eyes or skin or darkening of his urine.    Pertinent recent GI data:  MRCP November 10, 2021 IMPRESSION: 1. Extremely heterogeneous, nodular cirrhotic liver with extensive dysplastic/regenerative nodularity throughout. 2. In the posterior liver dome and posterior right lobe of the liver, there are multiple closely adjacent, heterogeneously rim enhancing, internal fluid signal lesions, largest of which measuring at least 9.4 cm. Suspect hepatic abscesses in the setting of clinically suspected cholecystitis/cholangitis. These lesions are not characteristic in appearance for hepatocellular carcinoma, although malignancy is not strictly excluded. 3. Cholelithiasis with severe gallbladder wall thickening and small volume pericholecystic  fluid. Although nonspecific in the setting of ascites, findings are suspicious for acute cholecystitis. 4. Enlarged portacaval lymph nodes, nonspecific. 5. Small volume ascites throughout the abdomen. 6.  Splenomegaly.  [place], November 10, 2021 IMPRESSION: Severe gallbladder wall thickening is noted with mild cholelithiasis and sludge within gallbladder lumen concerning for cholecystitis. Some pericholecystic fluid is noted as well.   Hepatic cirrhosis is again noted with the development of at least 3 solid masses within the liver, the largest measuring 5.6 cm in the right hepatic lobe. These are highly concerning for malignancy such as hepatocellular carcinoma or metastatic disease. MRI of the liver with and without gadolinium is recommended for further evaluation.  [place] Sept 2022  IMPRESSION: 1. Cirrhosis without focal liver lesion. Please note that multiphasic contrast enhanced MRI and CT of the most sensitive tests for the screening detection of hepatocellular carcinoma in the high risk setting of cirrhosis.   2.  Cholelithiasis without evidence of acute cholecystitis.  Liver Biopsy April 2023:  FINAL MICROSCOPIC DIAGNOSIS:   A. LIVER, NEEDLE CORE BIOPSY:  Infarcts with superimposed abscess formation.  Morphologic features consistent with primary biliary cirrhosis.  There are no diagnostic features of a primary or metastatic neoplasm.  Please see comment.   Comment: The patient's clinical history and results of imaging studies  are reviewed.  The submitted segments of liver reveal large areas of  necrosis with silhouettes of hepatic cells in the necrotic area.  Granulomas are not identified. The pattern is not suggestive of a  neoplastic process.  There is widening of the portal areas with  proliferation of small bile ducts and features of underlying primary  biliary cirrhosis.  This case was also reviewed by Dr. Jaquita Folds  on 11/14/2021.   Collected Updated Procedure   11/11/2021 0336 11/20/2021 0738 Mitochondrial antibodies [[id number]]  Blood   Component Value Units  Mitochondrial M2 Ab, IgG <20.0  Units       11/11/2021 0336 11/20/2021 0738 IgG [[id number]]   (Abnormal)  Blood   Component Value Units  IgG (Immunoglobin G), Serum 555 Low   mg/dL       11/11/2021 0336 11/20/2021 0738 Anti-smooth muscle antibody, IgG [[id number]]  Blood   Component Value Units  F-Actin IgG 4  Units       11/11/2021 0336 11/20/2021 0738 ANA [[id number]]  Blood   Component Value  Anti Nuclear Antibody (ANA) Negative       EGD November 11, 2021 Impression - The examined portions of the nasopharynx, oropharynx and larynx were normal. - Grade II esophageal varices treated. Completely eradicated following banding x 3. Although no stigmata seen on the varices, there were no other potential sources of abrupt hematemesis/melena. Although portal hypertensive gastropathy can cause upper GI bleeding, I suspect his hemorrhage was secondary to esophageal varices - Portal hypertensive gastropathy. - Normal examined duodenum. - No specimens collected.   Hospitalized April 10-14, 2023 for UGIB secondary to esophageal varices, new ascites  Serial therapuetic paracentesis starting in May 2023  Referred to [facility] April 2023, but did not follow up  Office Visit May 11th, 2023 Recommended colonoscopy: patient declined Recommended repeat EGD with variceal banding:  patient declined Recommended evaluation by hepatologist:  patient did not make appointment Started rifaximin for HE (intolerant to lactulose)  Past Medical History:  Diagnosis Date   Anxiety    CAD (coronary artery disease)    Nonobstructive by cath in [facility] reportedly in 2010 with 50% RCA and 50% ramus . Last adm  03/2009 with CP/mildly elevated troponin recommended for cath/echo but patient left AMA   Cirrhosis ([place])    History of abnormal LFTs. Hx of biopsy per patient. Pt also states negative for Hep C in the past.   Depression    Diabetes mellitus    Esophageal varices (HCC)    Gallbladder disease    GERD (gastroesophageal reflux disease)    Hyperlipidemia    Myocardial infarction  ([place])    2013   Polysubstance abuse ([place])    Hx of cocaine, tobacco, marijuana use     Past Surgical History:  Procedure Laterality Date   ESOPHAGEAL BANDING  11/11/2021   Procedure: ESOPHAGEAL BANDING;  Surgeon: Daryel November, MD;  Location: [facility] ENDOSCOPY;  Service: Gastroenterology;;   ESOPHAGOGASTRODUODENOSCOPY (EGD) WITH PROPOFOL N/A 11/11/2021   Procedure: ESOPHAGOGASTRODUODENOSCOPY (EGD) WITH PROPOFOL;  Surgeon: Daryel November, MD;  Location: [facility] ENDOSCOPY;  Service: Gastroenterology;  Laterality: N/A;   IR PARACENTESIS  12/01/2021   IR PARACENTESIS  12/16/2021   IR PARACENTESIS  01/08/2022   IR PARACENTESIS  01/26/2022   IR PARACENTESIS  02/16/2022   IR PARACENTESIS  03/31/2022   IR PARACENTESIS  04/20/2022   IR PARACENTESIS  04/29/2022   IR PARACENTESIS  05/06/2022   IR PARACENTESIS  05/13/2022   KNEE SURGERY     L knee ACL/meniscus repair 2002   Family History  Problem Relation Age of Onset   Breast cancer Mother    Lung cancer Mother        smoking history   Stroke Father        78s   Hypertension Father    Alzheimer's disease Father    Colon cancer Neg Hx    Esophageal cancer Neg Hx    Stomach cancer Neg Hx    Rectal cancer Neg Hx    Liver disease Neg Hx    Liver cancer Neg Hx    Social History   Tobacco Use   Smoking status: Some Days    Packs/day: 1.00    Years: 35.00    Total pack years: 35.00    Types: Cigarettes   Smokeless tobacco: Never  Substance Use Topics   Alcohol use: No   Drug use: Not Currently    Frequency: 1.0 times per week    Types: Cocaine, Marijuana    Comment: Pt u/a to quantify amt of cocaine. 30 year h/o marijuana use, 3-4 times per week    Current Outpatient Medications  Medication Sig Dispense Refill   Alpha-Lipoic Acid 200 MG CAPS Take 1 capsule by mouth daily.     Coenzyme Q10 (CO Q 10) 100 MG CAPS Take 1 capsule by mouth daily.     furosemide (LASIX) 80 MG tablet Take 0.5 tablets (40 mg total) by mouth daily. 30 tablet 6    glimepiride (AMARYL) 4 MG tablet Take 4 mg by mouth 2 (two) times daily.     hydrOXYzine (VISTARIL) 25 MG capsule Take 25 mg by mouth as needed for itching or anxiety.     lactulose, encephalopathy, (CHRONULAC) 10 GM/15ML SOLN Take 15 mLs (10 g total) by mouth daily. 240 mL 0   Melatonin 1 MG CAPS Take 1 capsule by mouth at bedtime.     metFORMIN (GLUCOPHAGE) 1000 MG tablet Take 1,000 mg by mouth 2 (two) times daily with a meal.     MILK THISTLE PO Take 1 capsule by mouth daily.     Multiple Vitamin (MULTIVITAMIN) tablet Take 1 tablet by mouth daily.  pantoprazole (PROTONIX) 40 MG tablet Take 1 tablet (40 mg total) by mouth daily. 30 tablet 0   pioglitazone (ACTOS) 45 MG tablet Take 45 mg by mouth daily.     propranolol (INDERAL) 10 MG tablet Take 1 tablet by mouth three times daily with dosage titrated by heart rate (resting heart rate 55-65). 90 tablet 3   rifaximin (XIFAXAN) 550 MG TABS tablet Take 1 tablet (550 mg total) by mouth 2 (two) times daily. 20 tablet 0   spironolactone (ALDACTONE) 100 MG tablet Take 1 tablet by mouth daily.     spironolactone (ALDACTONE) 100 MG tablet Take 1 tablet (100 mg total) by mouth daily. 30 tablet 6   Tetrahydrozoline HCl (VISINE OP) Place 1 drop into both eyes daily as needed (Dry eyes).     No current facility-administered medications for this visit.   No Known Allergies   Review of Systems: All systems reviewed and negative except where noted in HPI.    IR Paracentesis  Result Date: 05/13/2022 INDICATION: Patient with history of cirrhosis with recurrent ascites. Interventional radiology asked to perform a therapeutic paracentesis up to 8 L EXAM: ULTRASOUND GUIDED therapeutic LLQ PARACENTESIS MEDICATIONS: 5 cc 1% lidocaine COMPLICATIONS: None immediate. PROCEDURE: Informed written consent was obtained from the patient after a discussion of the risks, benefits and alternatives to treatment. A timeout was performed prior to the initiation of the  procedure. Initial ultrasound scanning demonstrates a large amount of ascites within the left lower abdominal quadrant. The right lower abdomen was prepped and draped in the usual sterile fashion. 1% lidocaine was used for local anesthesia. Following this, a 19 gauge, 7-cm, Yueh catheter was introduced. An ultrasound image was saved for documentation purposes. The paracentesis was performed. The catheter was removed and a dressing was applied. The patient tolerated the procedure well without immediate post procedural complication. Patient declined postprocedural albumin FINDINGS: A total of approximately 8 L of clear yellow fluid was removed. Ordering provider did not request laboratory samples IMPRESSION: Successful ultrasound-guided paracentesis yielding 8 liters of peritoneal fluid. PLAN: The patient has previously been formally evaluated by the [facility] Interventional Radiology Portal Hypertension Clinic and is being actively followed for potential future intervention. Read by: Reatha Armour, PA-C Electronically Signed   By: Sandi Mariscal M.D.   On: 05/13/2022 12:35   IR Paracentesis  Result Date: 05/06/2022 INDICATION: Patient with a history of cirrhosis with recurrent ascites. Interventional radiology asked to perform a therapeutic paracentesis up to 8 L. EXAM: ULTRASOUND GUIDED PARACENTESIS MEDICATIONS: 1% lidocaine 10 mL COMPLICATIONS: None immediate. PROCEDURE: Informed written consent was obtained from the patient after a discussion of the risks, benefits and alternatives to treatment. A timeout was performed prior to the initiation of the procedure. Initial ultrasound scanning demonstrates a large amount of ascites within the right lower abdominal quadrant. The right lower abdomen was prepped and draped in the usual sterile fashion. 1% lidocaine was used for local anesthesia. Following this, a 19 gauge, 7-cm, Yueh catheter was introduced. An ultrasound image was saved for documentation purposes. The  paracentesis was performed. The catheter was removed and a dressing was applied. The patient tolerated the procedure well without immediate post procedural complication. Patient refused post-procedure albumin. FINDINGS: A total of approximately 8 L of clear yellow fluid was removed. IMPRESSION: Successful ultrasound-guided paracentesis yielding 8 liters of peritoneal fluid. Read by: Soyla Dryer, NP PLAN: The patient has previously been formally evaluated by the [facility] Interventional Radiology Portal Hypertension Clinic and is being actively followed  for potential future intervention. Electronically Signed   By: Sandi Mariscal M.D.   On: 05/06/2022 10:48   IR Paracentesis  Result Date: 04/29/2022 INDICATION: Cirrhosis EXAM: ULTRASOUND GUIDED right lower quadrant therapeutic PARACENTESIS MEDICATIONS: 6 cc 1% lidocaine COMPLICATIONS: None immediate. PROCEDURE: Informed written consent was obtained from the patient after a discussion of the risks, benefits and alternatives to treatment. A timeout was performed prior to the initiation of the procedure. Initial ultrasound scanning demonstrates a large amount of ascites within the right lower abdominal quadrant. The right lower abdomen was prepped and draped in the usual sterile fashion. 1% lidocaine was used for local anesthesia. Following this, a 19 gauge, 7-cm, Yueh catheter was introduced. An ultrasound image was saved for documentation purposes. The paracentesis was performed. The catheter was removed and a dressing was applied. The patient tolerated the procedure well without immediate post procedural complication. Patient declined post-procedure intravenous albumin. FINDINGS: A total of approximately 8.0 L of yellow fluid was removed. Ordering provider did not request laboratory samples. IMPRESSION: Successful ultrasound-guided paracentesis yielding 8.0 liters of peritoneal fluid. PLAN: The patient has required >/=2 paracenteses in a 30 day period and a  formal evaluation by the [facility] has been arranged. Read by: Reatha Armour, PA-C Electronically Signed   By: Albin Felling M.D.   On: 04/29/2022 11:09   IR Paracentesis  Result Date: 04/20/2022 INDICATION: History of biliary cirrhosis with recurrent ascites. Request for diagnostic and therapeutic paracentesis with a max of 8 L. EXAM: ULTRASOUND GUIDED DIAGNOSTIC THERAPEUTIC RIGHT LOWER QUADRANT PARACENTESIS MEDICATIONS: 10 mL 1 % lidocaine COMPLICATIONS: None immediate. PROCEDURE: Informed written consent was obtained from the patient after a discussion of the risks, benefits and alternatives to treatment. A timeout was performed prior to the initiation of the procedure. Initial ultrasound scanning demonstrates a large amount of ascites within the right lower abdominal quadrant. The right lower abdomen was prepped and draped in the usual sterile fashion. 1% lidocaine was used for local anesthesia. Following this, a 19 gauge, 7-cm, Yueh catheter was introduced. An ultrasound image was saved for documentation purposes. The paracentesis was performed. The catheter was removed and a dressing was applied. The patient tolerated the procedure well without immediate post procedural complication. Patient received post-procedure intravenous albumin; see nursing notes for details. FINDINGS: A total of approximately 8 L of clear, yellow fluid was removed. Samples were sent to the laboratory as requested by the clinical team. IMPRESSION: Successful ultrasound-guided paracentesis yielding 8 liters of peritoneal fluid. PLAN: If the patient eventually requires >/=2 paracenteses in a 30 day period, candidacy for formal evaluation by the [facility] will be assessed. Read by: Narda Rutherford, AGNP-BC Electronically Signed   By: Albin Felling M.D.   On: 04/20/2022 11:23    Physical Exam: BP 128/72   Pulse 76   Ht 5' 10"   (1.778 m)   Wt 152 lb (68.9 kg)   BMI 21.81 kg/m  Constitutional: Pleasant, chronically ill appearing, Caucasian male in no acute distress. HEENT: Normocephalic and atraumatic. Conjunctivae are normal. No scleral icterus. Cardiovascular: Normal rate, regular rhythm.  Pulmonary/chest: Effort normal and breath sounds normal. No wheezing, rales or rhonchi. Abdominal: Distended, tense ascites, nontender. Bowel sounds active throughout. There are no masses palpable. Extremities: 2+ pitting edema up to level of knees bilaterally Neurological: Alert and oriented to person place and time.  No asterixis Skin: Skin is warm and dry. No rashes noted. Psychiatric: Normal mood and affect.  Behavior is normal.    CBC    Component Value Date/Time   WBC 3.0 (L) 12/11/2021 1159   RBC 3.86 (L) 12/11/2021 1159   HGB 9.6 (L) 12/11/2021 1159   HGB 12.5 (L) 03/19/2021 1458   HCT 29.7 (L) 12/11/2021 1159   PLT 88.0 (L) 12/11/2021 1159   PLT 56 (L) 03/19/2021 1458   MCV 77.0 (L) 12/11/2021 1159   MCH 27.7 11/14/2021 0208   MCHC 32.5 12/11/2021 1159   RDW 16.1 (H) 12/11/2021 1159   LYMPHSABS 0.4 (L) 12/11/2021 1159   MONOABS 0.5 12/11/2021 1159   EOSABS 0.1 12/11/2021 1159   BASOSABS 0.0 12/11/2021 1159    CMP     Component Value Date/Time   NA 131 (L) 03/27/2022 1021   K 3.9 03/27/2022 1021   CL 99 03/27/2022 1021   CO2 27 03/27/2022 1021   GLUCOSE 128 (H) 03/27/2022 1021   BUN 21 03/27/2022 1021   CREATININE 0.83 03/27/2022 1021   CREATININE 0.90 03/19/2021 1458   CALCIUM 8.9 03/27/2022 1021   PROT 6.5 03/27/2022 1021   ALBUMIN 3.7 03/27/2022 1021   AST 41 (H) 03/27/2022 1021   AST 63 (H) 03/19/2021 1458   ALT 35 03/27/2022 1021   ALT 67 (H) 03/19/2021 1458   ALKPHOS 270 (H) 03/27/2022 1021   BILITOT 0.5 03/27/2022 1021   BILITOT 0.6 03/19/2021 1458   GFRNONAA >60 11/14/2021 0208   GFRNONAA >60 03/19/2021 1458   GFRAA >90 09/03/2011 0545     ASSESSMENT AND PLAN: 61 year old male  with decompensated cirrhosis of unclear etiology.  Previously diagnosed with PSC, but recent biopsy more suggestive of PBC.  AMA antibodies negative.  Alk phos predominant liver enzyme elevation pattern with normal bilirubin seems more consistent with a PSC history, the patient has no history of IBD and no symptoms suggestive of IBD.  Given the histologic findings and his reluctance to meet with a hepatologist, I think it be reasonable to empirically treat with ursodiol to see if we can improve his alk phos and potentially slow further liver decline.  I again recommended the patient undergo repeat upper endoscopy for banding as well as undergo a colonoscopy both to evaluate for inflammatory bowel disease (which would confirm a diagnosis of PSC) as well as colon cancer screening, but the patient declined again. Regarding his refractory ascites, I do think he would benefit from TIPS.  His hepatic encephalopathy certainly be a concern, although his symptoms are minimal on no medications currently.  I suggested that he try lactulose again, especially since he is having issues with constipation now.  This lactulose would help with constipation and also help his hepatic encephalopathy.  He was again reminded that the lactulose dose should be titrated to achieve 2-3 bowel movements per day. Patient had an extremely abnormal MRI in April concerning for metastatic malignancy versus infection.  Liver biopsy showed features consistent with PBC, with questionable abscess.  His clinical history was never consistent with hepatic abscesses.  I would like to repeat MRI to screen for liver cancer and reevaluate for these abnormalities.  Biliary cirrhosis, decompensated (bleeding esophageal varices, hepatic encephalopathy, refractory ascites) -Start ursodiol 300 mg 3 times daily for possible PBC - Restart lactulose 15 mL by mouth every 4 hours as needed to achieve 2-3 bowel movements daily -MRI, AFP for HCC  screenig -Reinforced importance of low-sodium diet given refractory ascites and intolerance to diuretics - We will discuss candidacy for TIPS further with IR - Continue propranolol  at current dose (unable to increase dose due to hypotension/lightheadedness); patient unwilling to undergo a repeat upper endoscopy with variceal banding, hopefully TIPS will also address his variceal bleeding risk - Repeat MELD labs - Check for vitamin deficiencies (B12/folate, vitamin D, zinc)  Deseree Zemaitis E. Candis Schatz, MD [facility]   CC:  Leonard Downing, *

## 2022-05-21 NOTE — Procedures (Signed)
PROCEDURE SUMMARY:  Successful ultrasound guided paracentesis from the right lower quadrant.  Yielded 8.0 L of clear yellow fluid.  No immediate complications.  The patient tolerated the procedure well.   Specimen was not sent for labs.  EBL < 21mL  The patient has previously been formally evaluated by the [facility] and is being actively followed for potential future intervention.  Per Dr. Malachi Carl evaluation 04/29/22:  "[name] Ford is a 62 y.o. male with history of biliary cirrhosis (Child Pugh B, MELD 9) with recurrent ascites and history of bleeding esophageal varices (s/p banding), with mild suspected hepatic encephalopathy.  After preliminary evaluation, this patient would be a favorable candidate for TIPS creation, particularly if diuretic maximization is limited.  Candiss Norse, PA-C

## 2022-05-22 ENCOUNTER — Ambulatory Visit (HOSPITAL_COMMUNITY)
Admission: RE | Admit: 2022-05-22 | Discharge: 2022-05-22 | Disposition: A | Discharge status: Home / Self Care | Payer: PPO | Source: Ambulatory Visit | Attending: Gastroenterology | Admitting: Gastroenterology

## 2022-05-22 DIAGNOSIS — K7460 Unspecified cirrhosis of liver: Secondary | ICD-10-CM | POA: Diagnosis present

## 2022-05-22 LAB — AFP TUMOR MARKER: AFP-Tumor Marker: 2.2 ng/mL (ref <6.1)

## 2022-05-22 LAB — ZINC: Zinc: 60 ug/dL (ref 60–130)

## 2022-05-22 MED ORDER — GADOBUTROL 1 MMOL/ML IV SOLN
7.0000 mL | Freq: Once | INTRAVENOUS | Status: AC | PRN
  Administered 2022-05-22: 7 mL via INTRAVENOUS

## 2022-05-26 ENCOUNTER — Other Ambulatory Visit: Payer: Self-pay

## 2022-05-26 ENCOUNTER — Telehealth: Payer: Self-pay

## 2022-05-26 DIAGNOSIS — R188 Other ascites: Secondary | ICD-10-CM

## 2022-05-26 DIAGNOSIS — K745 Biliary cirrhosis, unspecified: Secondary | ICD-10-CM

## 2022-05-26 NOTE — Progress Notes (Signed)
[name] Ford, Your MRI showed a very small abnormality (5 mm in size).  This lesion is too small to characterize, but is recommended to repeat MRI in 6 months to see if it is stable in size.  The abnormalities suggestive of hepatic abscesses seen on your previous MRI appear to have resolved. We will plan for repeat MRI in 6 months.

## 2022-05-26 NOTE — Telephone Encounter (Signed)
-----   Message from Daryel November, MD sent at 05/26/2022  4:28 PM EDT ----- Regarding: TIPS candidate? Vaughan Basta,  Can you place an IR consult for TIPS for this patient?  Thanks

## 2022-05-27 ENCOUNTER — Other Ambulatory Visit (HOSPITAL_COMMUNITY): Payer: Self-pay | Admitting: Gastroenterology

## 2022-05-27 ENCOUNTER — Ambulatory Visit (HOSPITAL_COMMUNITY)
Admission: RE | Admit: 2022-05-27 | Discharge: 2022-05-27 | Disposition: A | Discharge status: Home / Self Care | Payer: PPO | Source: Ambulatory Visit | Attending: Gastroenterology | Admitting: Gastroenterology

## 2022-05-27 DIAGNOSIS — R188 Other ascites: Secondary | ICD-10-CM | POA: Diagnosis present

## 2022-05-27 HISTORY — PX: IR PARACENTESIS: IMG2679

## 2022-05-27 MED ORDER — LIDOCAINE HCL 1 % IJ SOLN
INTRAMUSCULAR | Status: AC
  Administered 2022-05-27: 10 mL
  Filled 2022-05-27: qty 20

## 2022-05-27 NOTE — Procedures (Signed)
PROCEDURE SUMMARY:  Successful ultrasound guided paracentesis from the left lower quadrant.  Yielded 8 L of clear yellow fluid.  No immediate complications.  The patient tolerated the procedure well.   Specimen not sent for labs.  EBL < 2 mL  The patient has previously been formally evaluated by the [facility] [facility] and is being actively followed for potential future intervention.  Soyla Dryer,  [phone] 05/27/2022, 9:59 AM

## 2022-06-03 ENCOUNTER — Ambulatory Visit (HOSPITAL_COMMUNITY)
Admission: RE | Admit: 2022-06-03 | Discharge: 2022-06-03 | Disposition: A | Discharge status: Home / Self Care | Payer: PPO | Source: Ambulatory Visit | Attending: Gastroenterology | Admitting: Gastroenterology

## 2022-06-03 ENCOUNTER — Other Ambulatory Visit (HOSPITAL_COMMUNITY): Payer: Self-pay | Admitting: Gastroenterology

## 2022-06-03 DIAGNOSIS — R188 Other ascites: Secondary | ICD-10-CM

## 2022-06-03 HISTORY — PX: IR PARACENTESIS: IMG2679

## 2022-06-03 MED ORDER — LIDOCAINE HCL 1 % IJ SOLN
INTRAMUSCULAR | Status: AC
  Filled 2022-06-03: qty 20

## 2022-06-03 NOTE — Procedures (Signed)
PROCEDURE SUMMARY:  Successful US guided paracentesis from right lateral abdomen.  Yielded 8 liters of clear yellow fluid.  No immediate complications.  Patient tolerated well.  EBL = trace  Madinah Quarry S Tyona Nilsen PA-C 06/03/2022 11:02 AM

## 2022-06-10 ENCOUNTER — Inpatient Hospital Stay (HOSPITAL_COMMUNITY): Admission: RE | Admit: 2022-06-10 | Payer: PPO | Source: Ambulatory Visit

## 2022-07-03 DEATH — deceased

## 2022-10-17 IMAGING — US US BIOPSY CORE LIVER
1 series · 13 of 25 positions shown · non-contrast
Comparison: MRI abdomen and US Abdomen, 11/10/2021.

INDICATION: PSC cirrhosis.  Liver lesions on MR abdomen.

EXAM:
ULTRASOUND GUIDED LIVER LESION BIOPSY

[Series 1: abdomen us · 26 acquisitions, 13 frames shown]
[im 1/26]
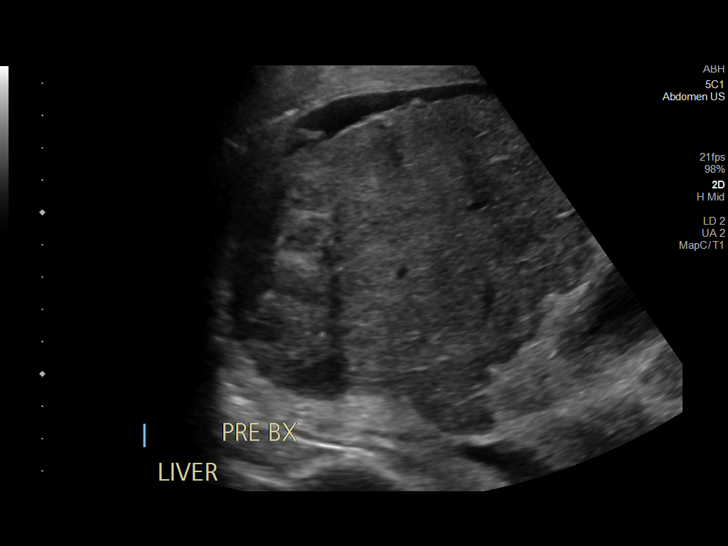
[im 3/26]
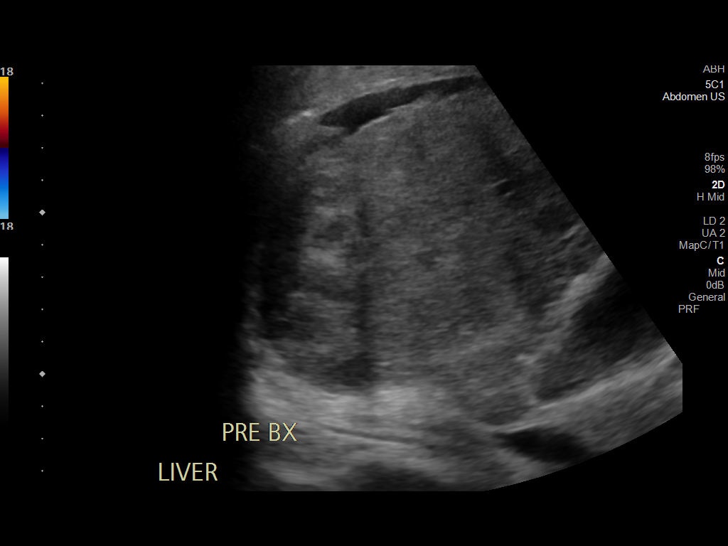
[im 5/26]
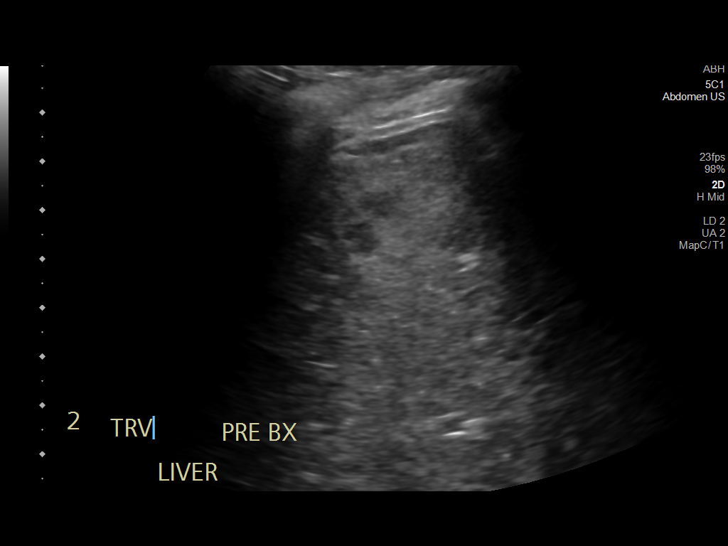
[im 7/26]
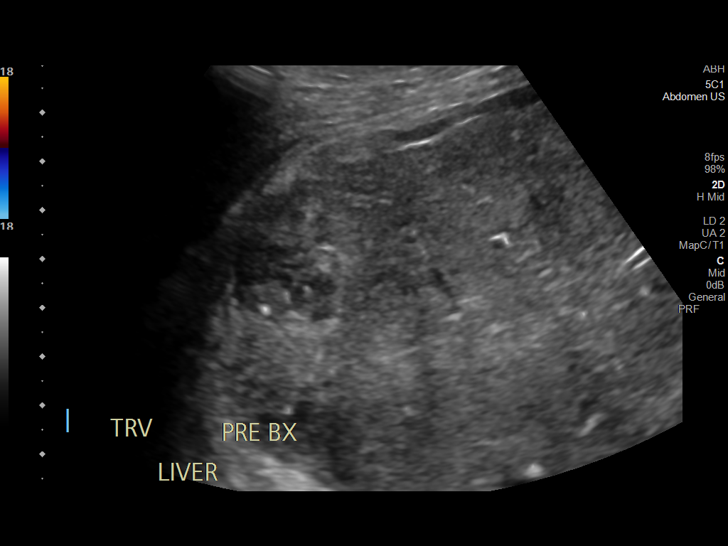
[im 9/26]
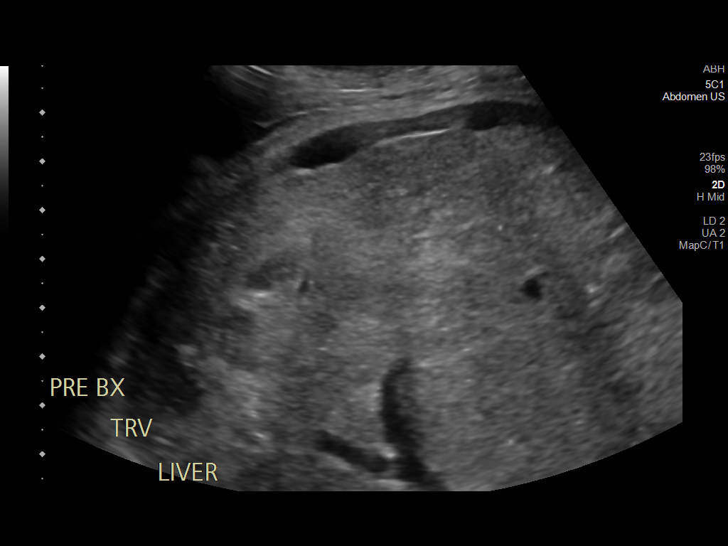
[im 11/26]
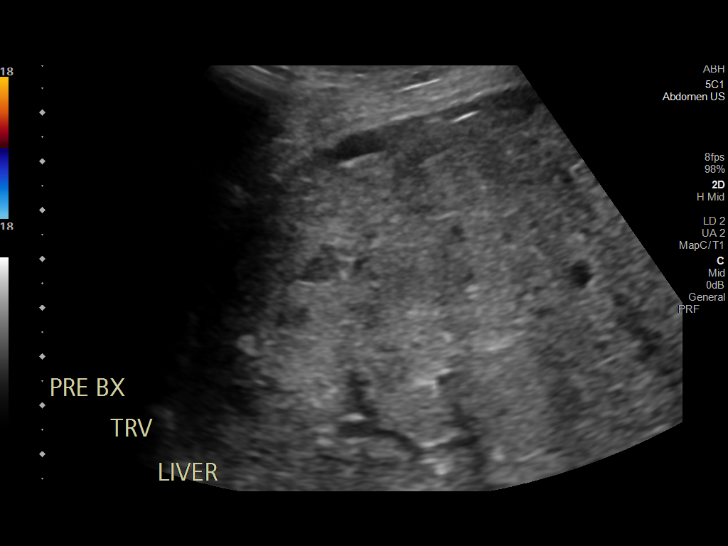
[im 13/26]
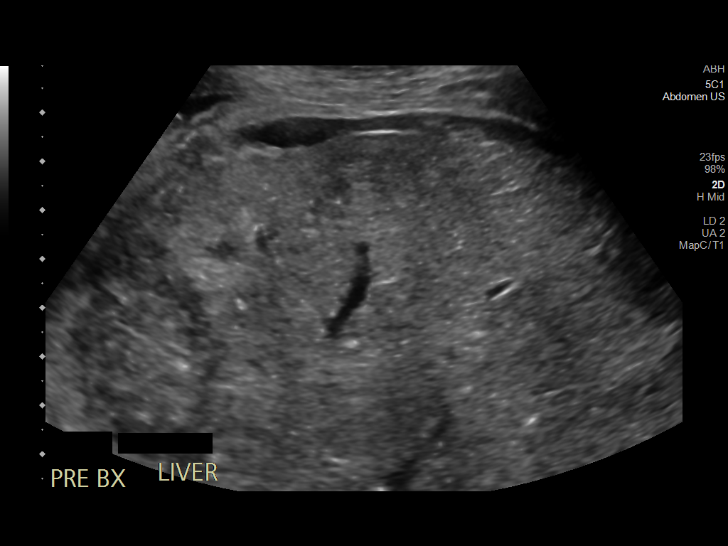
[im 15/26]
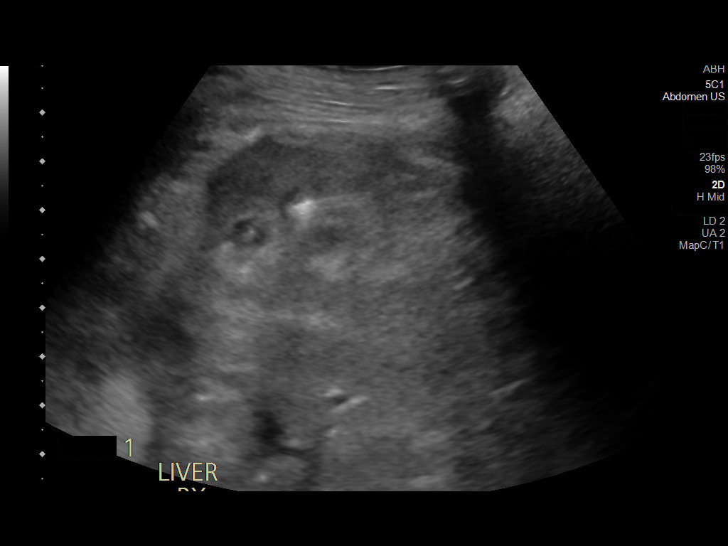
[im 17/26]
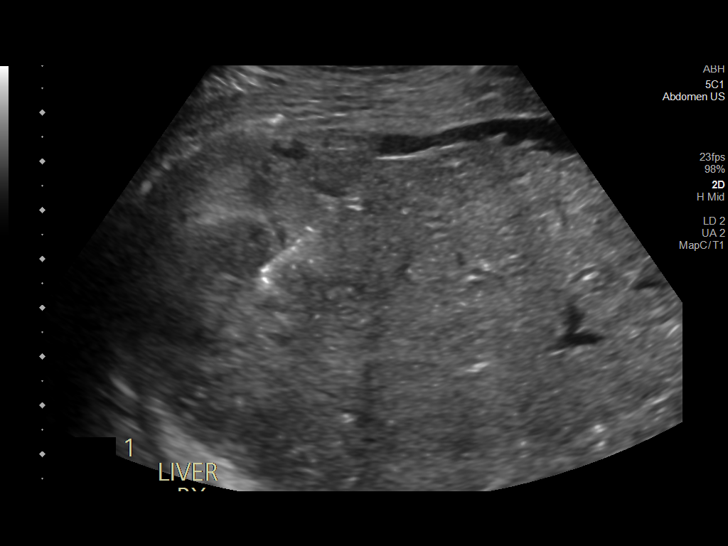
[im 19/26]
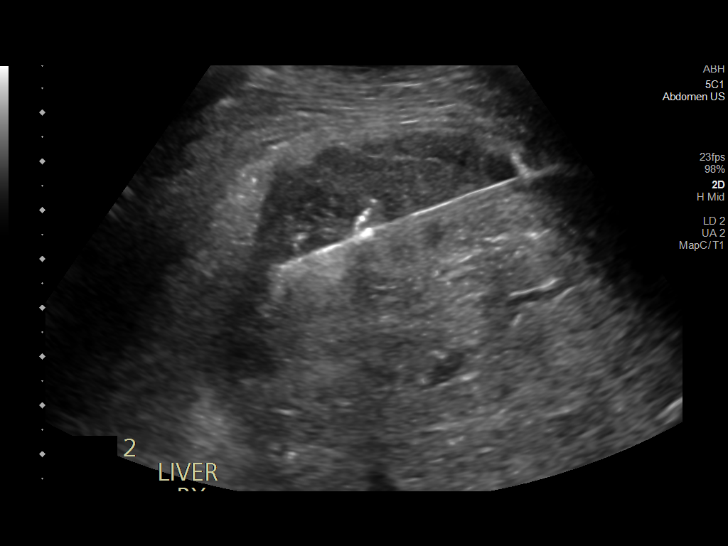
[im 21/26]
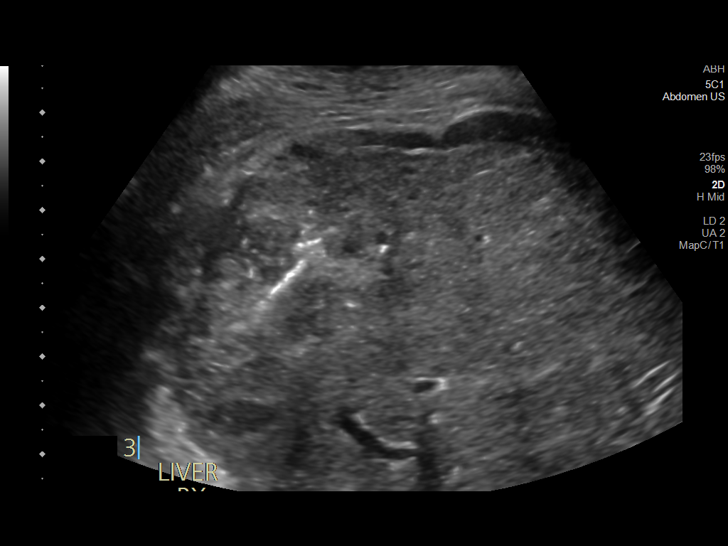
[im 23/26]
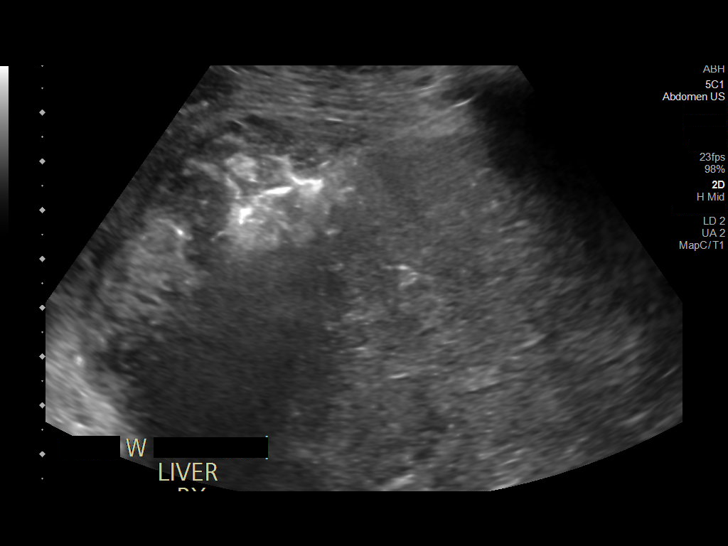
[im 26/26]
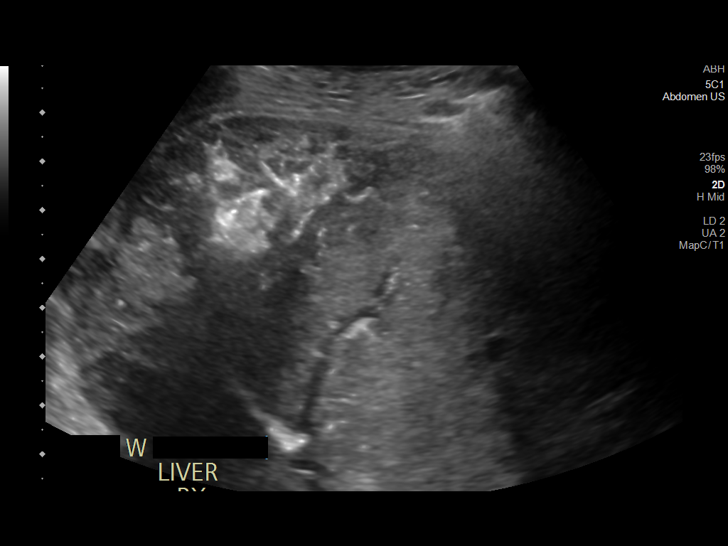

[13 of 25 positions shown; findings below may reference images not displayed]

US Abdomen,
04/15/2021.

MEDICATIONS:
None

ANESTHESIA/SEDATION:
Fentanyl 50 mcg IV; Versed 2 mg IV

Total Moderate Sedation time:  25 minutes.

The patient's level of consciousness and vital signs were monitored
continuously by radiology nursing throughout the procedure under my
direct supervision.

COMPLICATIONS:
None immediate.

PROCEDURE:
Informed written consent was obtained from the patient and/or
patient's representative after a discussion of the risks, benefits
and alternatives to treatment. The patient understands and consents
the procedure. A timeout was performed prior to the initiation of
the procedure.

Ultrasound scanning was performed of the right upper abdominal
quadrant demonstrates heterogeneously echogenic posterior superior
RIGHT hepatic lobe mass. No focal drainable collection.

The RIGHT hepatic lobe mass was selected for biopsy and the
procedure was planned. The right upper abdominal quadrant was
prepped and draped in the usual sterile fashion. The overlying soft
tissues were anesthetized with 1% lidocaine with epinephrine. A 17
gauge, 11 cm co-axial needle was advanced into a peripheral aspect
of the lesion. This was followed by 4 core biopsies with an 18 gauge
core device under direct ultrasound guidance.

The coaxial needle tract was embolized with a small amount of
Gel-Foam slurry and superficial hemostasis was obtained with manual
compression. Post procedural scanning was negative for definitive
area of hemorrhage or additional complication. A dressing was
placed. The patient tolerated the procedure well without immediate
post procedural complication.
FINDINGS: 1. No focal drainable collection within liver.
2. A small volume of perihepatic ascites is present.
3. Heterogeneously echogenic posterior RIGHT hepatic lobe mass. This
was targeted for biopsy.
IMPRESSION: 1. Successful ultrasound guided core biopsy of RIGHT hepatic lobe
mass.
2. No focal drainable collection was present within liver.
Aspiration was therefore not performed.

## 2022-12-02 ENCOUNTER — Other Ambulatory Visit: Payer: Self-pay

## 2022-12-02 DIAGNOSIS — K745 Biliary cirrhosis, unspecified: Secondary | ICD-10-CM
